# Patient Record
Sex: Female | Born: 1995 | Race: White | Hispanic: No | Marital: Single | Smoking: Current some day smoker
Health system: Southern US, Community
[De-identification: ages and names within clinical notes are randomized; demographics above are authoritative.]

## PROBLEM LIST (undated history)

## (undated) DIAGNOSIS — D649 Anemia, unspecified: Secondary | ICD-10-CM

## (undated) DIAGNOSIS — F32A Depression, unspecified: Secondary | ICD-10-CM

## (undated) DIAGNOSIS — F419 Anxiety disorder, unspecified: Secondary | ICD-10-CM

## (undated) HISTORY — DX: Anemia, unspecified: D64.9

## (undated) HISTORY — DX: Anxiety disorder, unspecified: F41.9

## (undated) HISTORY — DX: Depression, unspecified: F32.A

## (undated) HISTORY — PX: NO PAST SURGERIES: SHX2092

---

## 2021-02-03 ENCOUNTER — Ambulatory Visit
Admission: EM | Admit: 2021-02-03 | Discharge: 2021-02-03 | Disposition: A | Discharge status: Home / Self Care | Payer: Self-pay | Attending: Emergency Medicine | Admitting: Emergency Medicine

## 2021-02-03 ENCOUNTER — Other Ambulatory Visit: Payer: Self-pay

## 2021-02-03 DIAGNOSIS — J36 Peritonsillar abscess: Secondary | ICD-10-CM | POA: Insufficient documentation

## 2021-02-03 LAB — POCT RAPID STREP A (OFFICE): Rapid Strep A Screen: NEGATIVE

## 2021-02-03 MED ORDER — AMOXICILLIN-POT CLAVULANATE 875-125 MG PO TABS: 1.0000 | ORAL_TABLET | Freq: Two times a day (BID) | ORAL | 0 refills | Status: AC

## 2021-02-03 MED ORDER — IBUPROFEN 800 MG PO TABS: 800.0000 mg | ORAL_TABLET | Freq: Three times a day (TID) | ORAL | 0 refills | Status: DC

## 2021-02-03 NOTE — ED Triage Notes (Signed)
Pt present sore throat with enlargement of there tonsils. Pt states symptom started two days ago. Pt states it hurts to swallow and her throat feels dry.

## 2021-02-03 NOTE — ED Provider Notes (Signed)
[facility]    CSN: [id number] Arrival date & time: 02/03/21  1100      History   Chief Complaint Chief Complaint  Patient presents with  . Sore Throat    HPI Debra Ellison is a 25 y.o. female presenting today for evaluation of sore throat.  Reports swollen tonsils beginning 2 days ago.  Reports pain and swelling left-sided.  Swollen lymph node tonight.  Noticing pus and exudate on tonsil.  Reports associated congestion, denies associated cough.  Denies fevers.  HPI  History reviewed. No pertinent past medical history.  There are no problems to display for this patient.   History reviewed. No pertinent surgical history.  OB History   No obstetric history on file.      Home Medications    Prior to Admission medications   Medication Sig Start Date End Date Taking? Authorizing Provider  amoxicillin-clavulanate (AUGMENTIN) 875-125 MG tablet Take 1 tablet by mouth every 12 (twelve) hours for 10 days. 02/03/21 02/13/21 Yes Accalia Rigdon C, PA-C  ibuprofen (ADVIL) 800 MG tablet Take 1 tablet (800 mg total) by mouth 3 (three) times daily. 02/03/21  Yes Yahye Siebert, Junius Creamer, PA-C    Family History Family History  Family history unknown: Yes    Social History Social History   Tobacco Use  . Smoking status: Current Every Day Smoker  . Smokeless tobacco: Current User     Allergies   Patient has no known allergies.   Review of Systems Review of Systems  Constitutional: Negative for activity change, appetite change, chills, fatigue and fever.  HENT: Positive for sore throat. Negative for congestion, ear pain, rhinorrhea, sinus pressure and trouble swallowing.   Eyes: Negative for discharge and redness.  Respiratory: Negative for cough, chest tightness and shortness of breath.   Cardiovascular: Negative for chest pain.  Gastrointestinal: Negative for abdominal pain, diarrhea, nausea and vomiting.  Musculoskeletal: Negative for myalgias.  Skin:  Negative for rash.  Neurological: Negative for dizziness, light-headedness and headaches.     Physical Exam Triage Vital Signs ED Triage Vitals [02/03/21 1138]  Enc Vitals Group     BP      Pulse      Resp      Temp      Temp src      SpO2      Weight      Height      Head Circumference      Peak Flow      Pain Score 10     Pain Loc      Pain Edu?      Excl. in GC?    No data found.  Updated Vital Signs BP 107/75 (BP Location: Left Arm)   Pulse 97   Temp 98.4 F (36.9 C) (Oral)   Resp 16   LMP 02/03/2021   SpO2 98%   Visual Acuity Right Eye Distance:   Left Eye Distance:   Bilateral Distance:    Right Eye Near:   Left Eye Near:    Bilateral Near:     Physical Exam Vitals and nursing note reviewed.  Constitutional:      Appearance: She is well-developed.     Comments: No acute distress  HENT:     Head: Normocephalic and atraumatic.     Ears:     Comments: Bilateral ears without tenderness to palpation of external auricle, tragus and mastoid, EAC's without erythema or swelling, TM's with good bony landmarks and cone of light.  Non erythematous.     Nose: Nose normal.     Mouth/Throat:     Comments: Oral mucosa pink and moist, left tonsillar area with erythema and swelling, no significant exudate noted, uvula midline without swelling or deviation. Posterior pharynx patent and nonerythematous,. Normal phonation.  Eyes:     Conjunctiva/sclera: Conjunctivae normal.  Cardiovascular:     Rate and Rhythm: Normal rate.  Pulmonary:     Effort: Pulmonary effort is normal. No respiratory distress.     Comments: Breathing comfortably at rest, CTABL, no wheezing, rales or other adventitious sounds auscultated Abdominal:     General: There is no distension.  Musculoskeletal:        General: Normal range of motion.     Cervical back: Neck supple.  Skin:    General: Skin is warm and dry.  Neurological:     Mental Status: She is alert and oriented to person,  place, and time.      UC Treatments / Results  Labs (all labs ordered are listed, but only abnormal results are displayed) Labs Reviewed  CULTURE, GROUP A STREP [facility])  POCT RAPID STREP A (OFFICE)    EKG   Radiology No results found.  Procedures Procedures (including critical care time)  Medications Ordered in UC Medications - No data to display  Initial Impression / Assessment and Plan / UC Course  I have reviewed the triage vital signs and the nursing notes.  Pertinent labs & imaging results that were available during my care of the patient were reviewed by me and considered in my medical decision making (see chart for details).     Left-sided tonsillar swelling and erythema, concerning for possible early peritonsillar abscess, treating with Augmentin x10 days and anti-inflammatories for pain and swelling.  Warm compresses.  No airway compromise at this time.  Discussed strict return precautions. Patient verbalized understanding and is agreeable with plan.  Final Clinical Impressions(s) / UC Diagnoses   Final diagnoses:  Peritonsillitis     Discharge Instructions     Begin Augmentin twice daily for 10 days Tylenol and ibuprofen for pain and swelling Warm compresses to neck Follow-up in emergency room if developing increased pain swelling, difficulty breathing    ED Prescriptions    Medication Sig Dispense Auth. Provider   amoxicillin-clavulanate (AUGMENTIN) 875-125 MG tablet Take 1 tablet by mouth every 12 (twelve) hours for 10 days. 20 tablet Karelyn Brisby C, PA-C   ibuprofen (ADVIL) 800 MG tablet Take 1 tablet (800 mg total) by mouth 3 (three) times daily. 21 tablet Mairead Schwarzkopf, [place] C, PA-C     PDMP not reviewed this encounter.   Sharyon Cable [place] C, PA-C 02/03/21 1213

## 2021-02-03 NOTE — Discharge Instructions (Signed)
Begin Augmentin twice daily for 10 days Tylenol and ibuprofen for pain and swelling Warm compresses to neck Follow-up in emergency room if developing increased pain swelling, difficulty breathing

## 2021-02-05 DIAGNOSIS — R569 Unspecified convulsions: Secondary | ICD-10-CM

## 2021-02-05 HISTORY — DX: Unspecified convulsions: R56.9

## 2021-02-06 LAB — CULTURE, GROUP A STREP (THRC)

## 2021-03-08 ENCOUNTER — Emergency Department (HOSPITAL_COMMUNITY)
Admission: EM | Admit: 2021-03-08 | Discharge: 2021-03-09 | Disposition: A | Discharge status: Home / Self Care | Payer: Medicaid - Out of State | Attending: Emergency Medicine | Admitting: Emergency Medicine

## 2021-03-08 ENCOUNTER — Other Ambulatory Visit: Payer: Self-pay

## 2021-03-08 DIAGNOSIS — F17200 Nicotine dependence, unspecified, uncomplicated: Secondary | ICD-10-CM | POA: Insufficient documentation

## 2021-03-08 DIAGNOSIS — N939 Abnormal uterine and vaginal bleeding, unspecified: Secondary | ICD-10-CM | POA: Diagnosis present

## 2021-03-08 DIAGNOSIS — R109 Unspecified abdominal pain: Secondary | ICD-10-CM

## 2021-03-08 LAB — I-STAT BETA HCG BLOOD, ED (MC, WL, AP ONLY): I-stat hCG, quantitative: <5 m[IU]/mL (ref <5)

## 2021-03-08 NOTE — ED Triage Notes (Signed)
Vaginal bleeding x 3days and passed blood clot early today. Just moved here so havent seen a OBgyne yet.

## 2021-03-08 NOTE — ED Provider Notes (Signed)
Emergency Medicine Provider Triage Evaluation Note  Debra Ellison , a 25 y.o. female  was evaluated in triage.  Pt complains of vaginal bleeding in early pregnancy.  She states she is about 4-[redacted] weeks along.  She states that this is her 2nd pregnancy.  She began having bleeding 2 days ago.  She reports associated abdominal cramps.  Also states she had a seizure 3 days ago.    Review of Systems  Positive: Vaginal bleeding, abdominal cramps Negative: fever  Physical Exam  Ht 5\' 4"  (1.626 m)   Wt 75.3 kg   BMI 28.49 kg/m  Gen:   Awake, no distress   Resp:  Normal effort  MSK:   Moves extremities without difficulty    Medical Decision Making  Medically screening exam initiated at 10:46 PM.  Appropriate orders placed.  Debra Ellison was informed that the remainder of the evaluation will be completed by another provider, this initial triage assessment does not replace that evaluation, and the importance of remaining in the ED until their evaluation is complete.  Will verify pregnancy status with HCG.  If positive, will call MAU.  11:17 PM Preg negative.  OK for waiting room for further evaluation of vaginal bleeding.   Barnie Del, PA-C 03/08/21 2318    05/08/21, DO 03/08/21 2339

## 2021-03-09 ENCOUNTER — Emergency Department (HOSPITAL_COMMUNITY): Payer: Medicaid - Out of State

## 2021-03-09 LAB — CBC
HCT: 37.5 % (ref 36.0–46.0)
Hemoglobin: 12 g/dL (ref 12.0–15.0)
MCH: 29.9 pg (ref 26.0–34.0)
MCHC: 32 g/dL (ref 30.0–36.0)
MCV: 93.3 fL (ref 80.0–100.0)
Platelets: 274 10*3/uL (ref 150–400)
RBC: 4.02 MIL/uL (ref 3.87–5.11)
RDW: 14.2 % (ref 11.5–15.5)
WBC: 9.8 10*3/uL (ref 4.0–10.5)
nRBC: 0 % (ref 0.0–0.2)

## 2021-03-09 LAB — PREGNANCY, URINE: Preg Test, Ur: NEGATIVE

## 2021-03-09 LAB — RH IG WORKUP (INCLUDES ABO/RH)
ABO/RH(D): A POS
Gestational Age(Wks): 0

## 2021-03-09 NOTE — ED Provider Notes (Addendum)
[facility] EMERGENCY DEPARTMENT Provider Note   CSN: [id number] Arrival date & time: 03/08/21  2235     History Chief Complaint  Patient presents with  . Vaginal Bleeding    Debra Ellison is a 25 y.o. female.  The history is provided by the patient and medical records.  Vaginal Bleeding   25 y.o. F here with vaginal bleeding.  She thinks she is approx 4-5 weeks, had numerous at home pregnancy tests that were ALL positive.  She states they just moved from [facility] and have not yet found an OBGYN.  She states bleeding started today, passed a large clot, then started having intense cramping.  Denies vomiting, fevers, dysuria, etc.  She reports miscarriage in the past about 2 years ago.    Also reports seizure 3 days ago after she ran out of xanax.  She has since restarted her medication and is back to baseline.  No hx of seizures in the past.    No past medical history on file.  There are no problems to display for this patient.   No past surgical history on file.   OB History   No obstetric history on file.     Family History  Family history unknown: Yes    Social History   Tobacco Use  . Smoking status: Current Every Day Smoker  . Smokeless tobacco: Current User    Home Medications Prior to Admission medications   Medication Sig Start Date End Date Taking? Authorizing Provider  ibuprofen (ADVIL) 800 MG tablet Take 1 tablet (800 mg total) by mouth 3 (three) times daily. 02/03/21   Wieters, Hallie C, PA-C    Allergies    Patient has no known allergies.  Review of Systems   Review of Systems  Genitourinary: Positive for vaginal bleeding.  All other systems reviewed and are negative.   Physical Exam Updated Vital Signs BP 128/80 (BP Location: Right Arm)   Pulse 75   Temp 98.4 F (36.9 C) (Oral)   Resp 18   Ht 5\' 4"  (1.626 m)   Wt 75.3 kg   LMP 01/21/2021   SpO2 100%   BMI 28.49 kg/m   Physical Exam Vitals and nursing note  reviewed.  Constitutional:      Appearance: She is well-developed.  HENT:     Head: Normocephalic and atraumatic.  Eyes:     Conjunctiva/sclera: Conjunctivae normal.     Pupils: Pupils are equal, round, and reactive to light.  Cardiovascular:     Rate and Rhythm: Normal rate and regular rhythm.     Heart sounds: Normal heart sounds.  Pulmonary:     Effort: Pulmonary effort is normal.     Breath sounds: Normal breath sounds.  Abdominal:     General: Bowel sounds are normal.     Palpations: Abdomen is soft.     Tenderness: There is no abdominal tenderness. There is no rebound.  Musculoskeletal:        General: Normal range of motion.     Cervical back: Normal range of motion.  Skin:    General: Skin is warm and dry.  Neurological:     Mental Status: She is alert and oriented to person, place, and time.     ED Results / Procedures / Treatments   Labs (all labs ordered are listed, but only abnormal results are displayed) Labs Reviewed  CBC  PREGNANCY, URINE  I-STAT BETA HCG BLOOD, ED ([facility], [facility], [facility] ONLY)  RH IG WORKUP (  INCLUDES ABO/RH)    EKG None  Radiology US PELVIS (TRANSABDOMINAL ONLY)  Result Date: 03/09/2021 CLINICAL DATA:  Vaginal bleeding. EXAM: TRANSABDOMINAL ULTRASOUND OF PELVIS DOPPLER ULTRASOUND OF OVARIES TECHNIQUE: Both transabdominal and transvaginal ultrasound examinations of the pelvis were performed. Transabdominal technique was performed for global imaging of the pelvis including uterus, ovaries, adnexal regions, and pelvic cul-de-sac. Patient refused transvaginal exam. COMPARISON:  No prior. FINDINGS: Uterus Measurements: 8.0 x 4.1 x 5.1 cm = volume: 87.4 mL. No fibroids or other mass visualized. Endometrium Thickness: 7.5.  No focal abnormality visualized. Right ovary Measurements: 4.0 x 2.3 x 3.7 cm = volume: 17.6 mL. Normal appearance/no adnexal mass. Left ovary Measurements: 3.5 x 2.0 x 2.8 cm = volume: 9.9 mL. Normal appearance/no adnexal mass. Pulsed  Doppler evaluation of both ovaries demonstrates normal low-resistance arterial and venous waveforms. Other findings No abnormal free fluid. IMPRESSION: No acute or focal abnormality identified. No evidence of ovarian torsion. Electronically Signed   By: Maisie Fus  Register   On: 03/09/2021 05:18   US PELVIC DOPPLER (TORSION R/O OR MASS ARTERIAL FLOW)  Result Date: 03/09/2021 CLINICAL DATA:  Vaginal bleeding. EXAM: TRANSABDOMINAL ULTRASOUND OF PELVIS DOPPLER ULTRASOUND OF OVARIES TECHNIQUE: Both transabdominal and transvaginal ultrasound examinations of the pelvis were performed. Transabdominal technique was performed for global imaging of the pelvis including uterus, ovaries, adnexal regions, and pelvic cul-de-sac. Patient refused transvaginal exam. COMPARISON:  No prior. FINDINGS: Uterus Measurements: 8.0 x 4.1 x 5.1 cm = volume: 87.4 mL. No fibroids or other mass visualized. Endometrium Thickness: 7.5.  No focal abnormality visualized. Right ovary Measurements: 4.0 x 2.3 x 3.7 cm = volume: 17.6 mL. Normal appearance/no adnexal mass. Left ovary Measurements: 3.5 x 2.0 x 2.8 cm = volume: 9.9 mL. Normal appearance/no adnexal mass. Pulsed Doppler evaluation of both ovaries demonstrates normal low-resistance arterial and venous waveforms. Other findings No abnormal free fluid. IMPRESSION: No acute or focal abnormality identified. No evidence of ovarian torsion. Electronically Signed   By: Maisie Fus  Register   On: 03/09/2021 05:18    Procedures Procedures   Medications Ordered in ED Medications - No data to display  ED Course  I have reviewed the triage vital signs and the nursing notes.  Pertinent labs & imaging results that were available during my care of the patient were reviewed by me and considered in my medical decision making (see chart for details).    MDM Rules/Calculators/A&P                          25 year old female here with vaginal bleeding.  Reports she is approximately 4 to [redacted] weeks  pregnant with multiple home test that were all positive.  Recently moved here from [place].  States bleeding began today, passed large clot, now with intense cramping.  She is afebrile and nontoxic in appearance.  Her abdomen is soft and nontender.  Vitals are stable.  Labs are not consistent with pregnancy.  When discussing this with her, she remains concerned for miscarriage as she reports "when I was pregnant with my daughter my numbers were undetectable for about 2 months".   Discussed that even if miscarriage today, her hcg should still be positive to a degree.  She is not willing to accept this.  Will obtain US.  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  She is back on her Xanax and has not had any further issues.  Do not feel this requires  further work-up.  5:35 AM [place] without acute findings suggestive of recent miscarriage, no retained products, etc.  Patient given results, still appears to be somewhat in disbelief of this. May ultimately be missed abortion which I have tried to explain to her, not entirely sure she understands.  She is desiring future pregnancy so will refer to OB-GYN for ongoing management.  Can return here for new concerns.  Final Clinical Impression(s) / ED Diagnoses Final diagnoses:  Vaginal bleeding    Rx / DC Orders ED Discharge Orders    None       Garlon Hatchet, PA-C 03/09/21 0538    Garlon Hatchet, PA-C 03/09/21 3662    Marily Memos, MD 03/09/21 [phone]

## 2021-03-09 NOTE — Discharge Instructions (Signed)
Call and schedule appt with OB-GYN. Return here for new concerns.

## 2021-05-29 ENCOUNTER — Other Ambulatory Visit: Payer: Self-pay | Admitting: Obstetrics and Gynecology

## 2021-05-29 DIAGNOSIS — Z363 Encounter for antenatal screening for malformations: Secondary | ICD-10-CM

## 2021-06-15 ENCOUNTER — Ambulatory Visit: Payer: Medicaid Other

## 2021-06-26 ENCOUNTER — Encounter: Payer: Self-pay | Admitting: *Deleted

## 2021-06-28 ENCOUNTER — Other Ambulatory Visit: Payer: Self-pay | Admitting: *Deleted

## 2021-06-28 ENCOUNTER — Ambulatory Visit: Payer: Medicaid Other | Admitting: *Deleted

## 2021-06-28 ENCOUNTER — Ambulatory Visit: Payer: Medicaid Other | Attending: Obstetrics and Gynecology

## 2021-06-28 ENCOUNTER — Ambulatory Visit (HOSPITAL_BASED_OUTPATIENT_CLINIC_OR_DEPARTMENT_OTHER): Payer: Medicaid Other | Admitting: Maternal & Fetal Medicine

## 2021-06-28 ENCOUNTER — Other Ambulatory Visit: Payer: Self-pay

## 2021-06-28 ENCOUNTER — Encounter: Payer: Self-pay | Admitting: *Deleted

## 2021-06-28 VITALS — BP 113/65 | HR 66

## 2021-06-28 DIAGNOSIS — O09891 Supervision of other high risk pregnancies, first trimester: Secondary | ICD-10-CM | POA: Diagnosis not present

## 2021-06-28 DIAGNOSIS — Z8759 Personal history of other complications of pregnancy, childbirth and the puerperium: Secondary | ICD-10-CM

## 2021-06-28 DIAGNOSIS — Z363 Encounter for antenatal screening for malformations: Secondary | ICD-10-CM | POA: Insufficient documentation

## 2021-06-28 DIAGNOSIS — Z362 Encounter for other antenatal screening follow-up: Secondary | ICD-10-CM

## 2021-06-28 IMAGING — US US MFM OB COMPLETE +14 WKS
1 series · 14 of 28 positions shown · non-contrast
Comparison: none

[Series 1: us mfm ob complete +14 wks · 31 acquisitions, 14 frames shown]
[im 2/31]
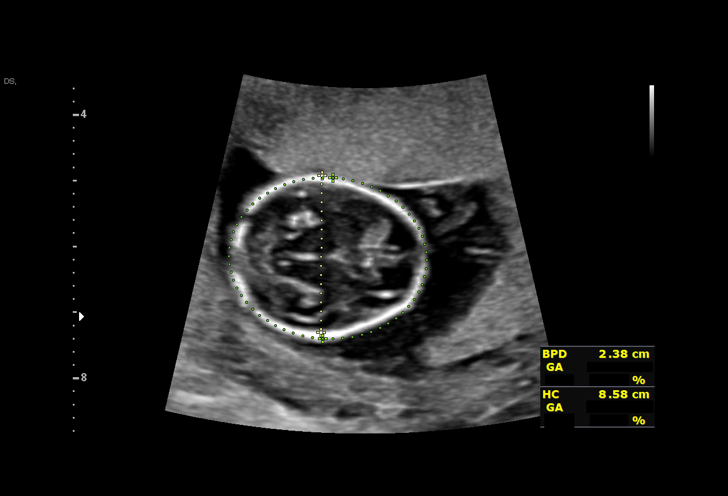
[im 4/31]
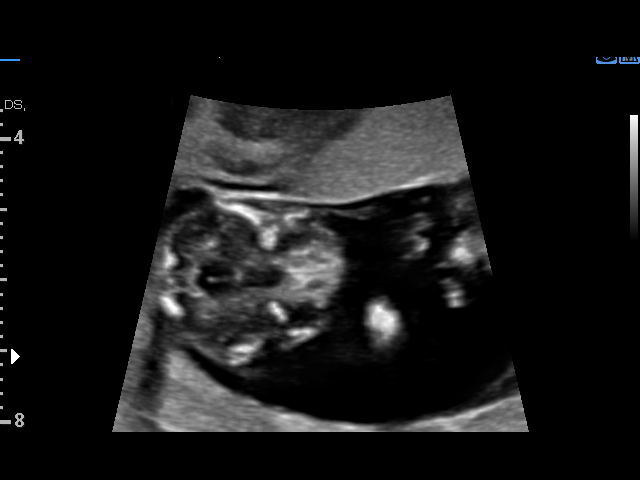
[im 6/31]
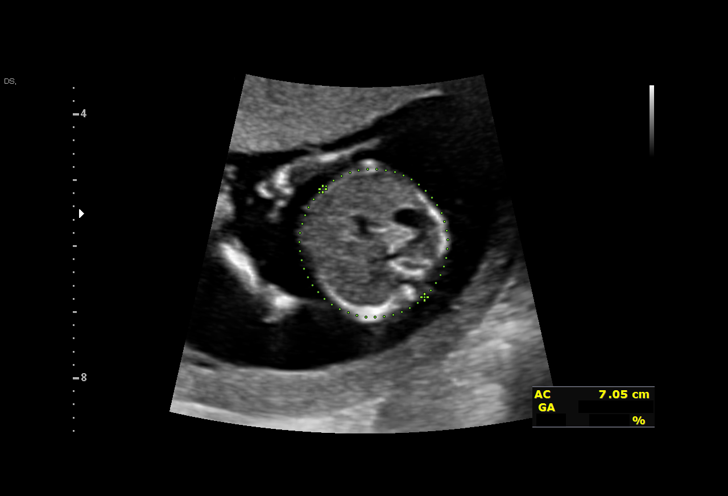
[im 8/31]
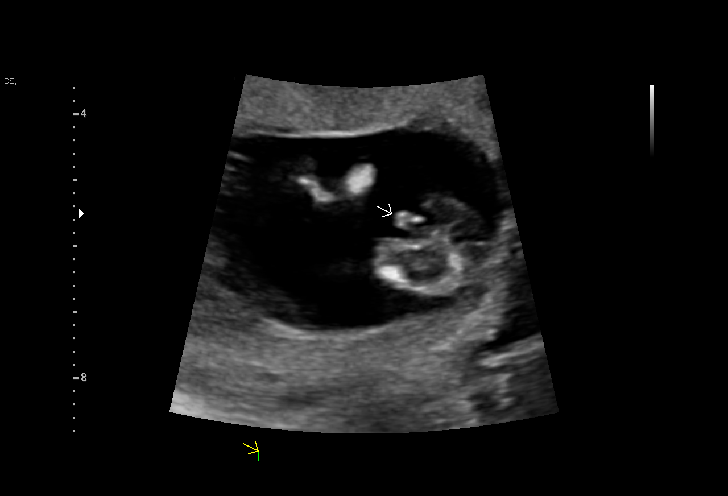
[im 11/31]
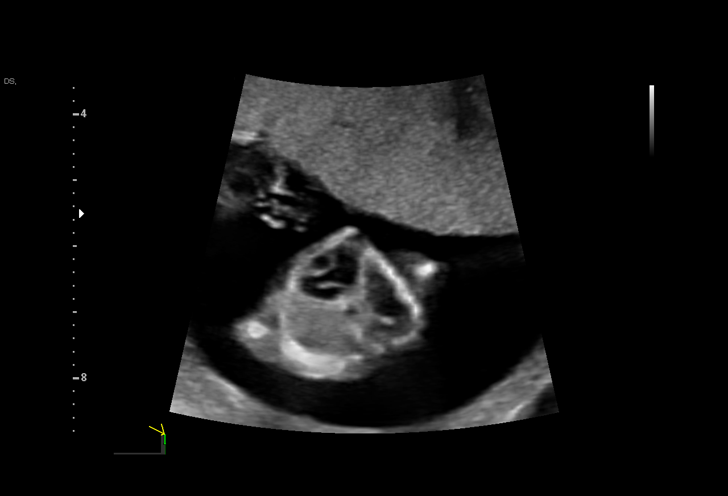
[im 13/31]
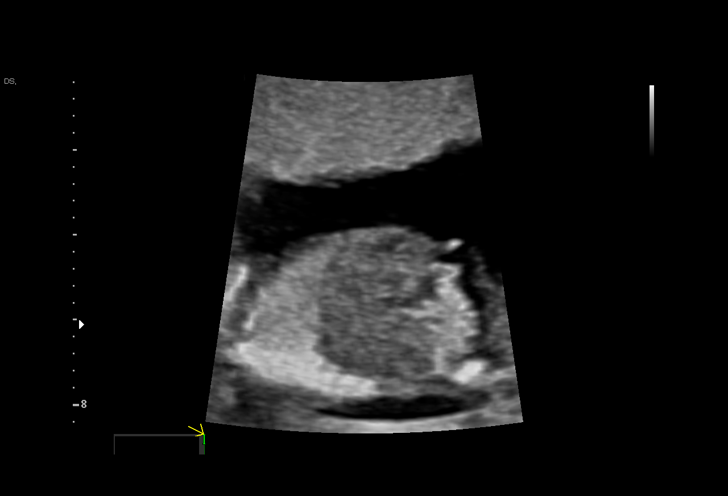
[im 15/31]
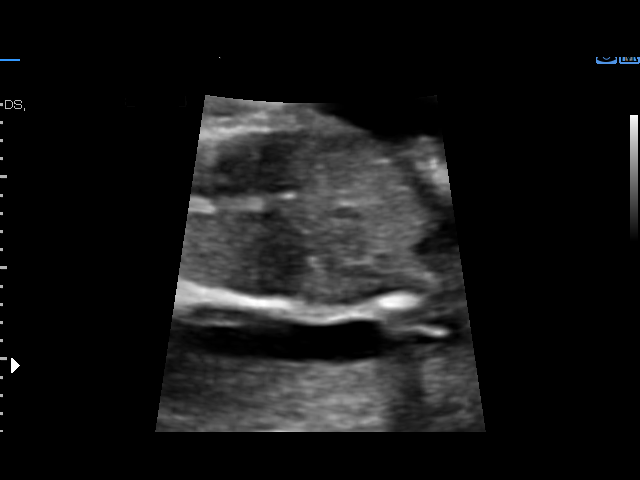
[im 17/31]
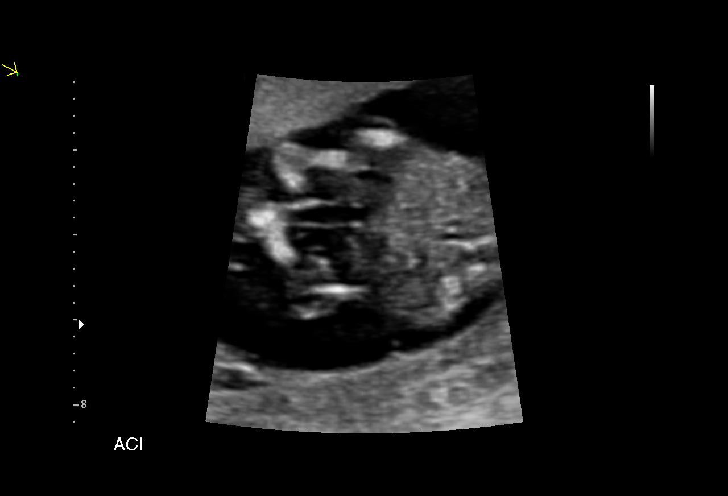
[im 19/31]
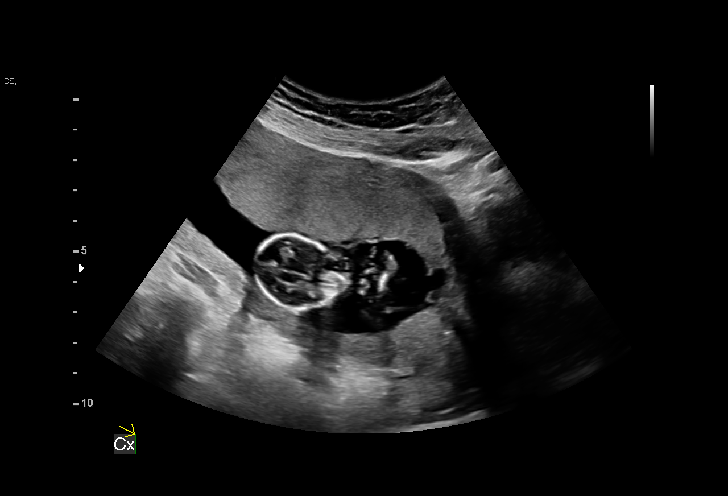
[im 22/31]
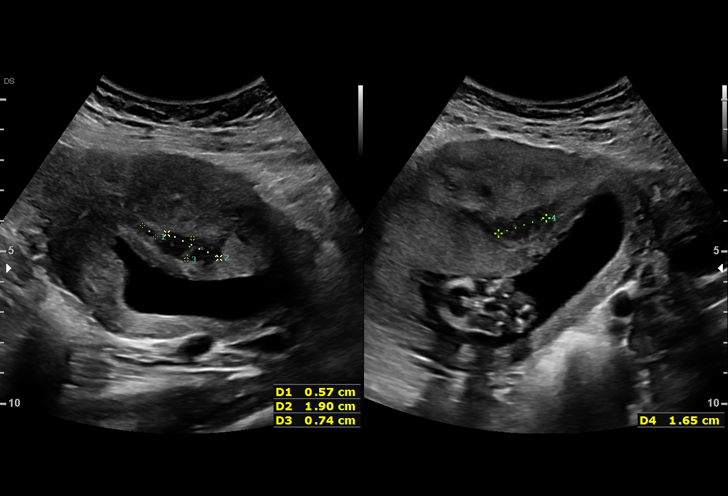
[im 24/31]
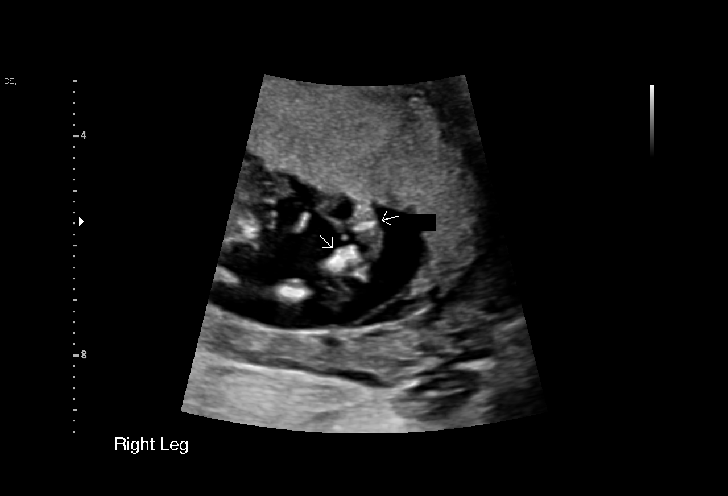
[im 26/31]
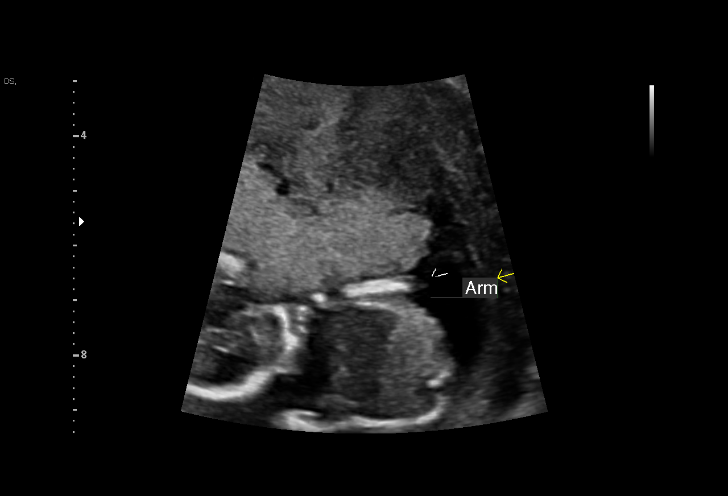
[im 28/31]
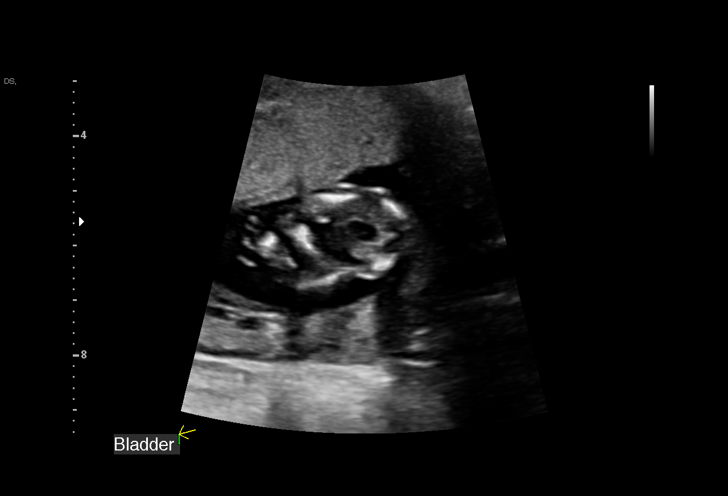
[im 31/31]
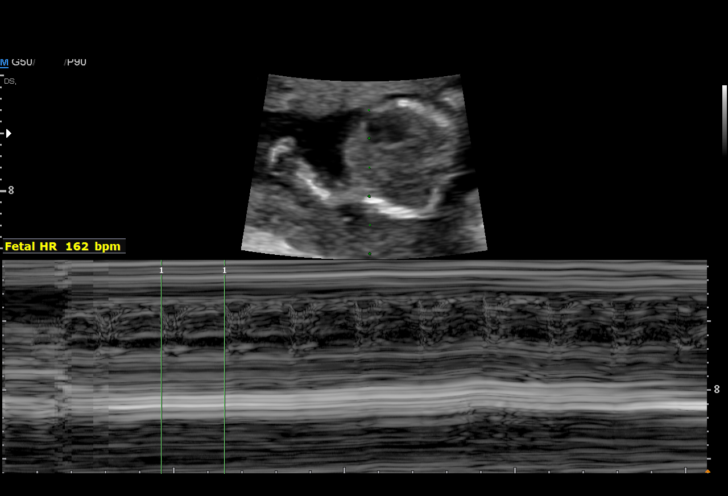

[14 of 28 positions shown; findings below may reference images not displayed]

OBGYN
                                                            [REDACTED]
                   BRONDONNE

 1  US MFM OB COMP LESS THAN              76801.4     BRONDONNE
    14 WEEKS

Indications

 13 weeks gestation of pregnancy
 Encounter for antenatal screening for          [V7]
 malformations
 Poor obstetric history: Previous IUFD          [V7]
 (stillbirth)
Fetal Evaluation

 Num Of Fetuses:         1
 Cardiac Activity:       Observed
 Placenta:               Anterior

 Amniotic Fluid
 AFI FV:      Within normal limits
Biometry

 BPD:      23.8  mm     G. Age:  14w 0d                  CI:        77.15   %    70 - 86
                                                         FL/HC:      14.1   %
 HC:       85.8  mm     G. Age:  13w 5d                  HC/AC:      1.21        1.14 -
 AC:       71.1  mm     G. Age:  13w 5d                  FL/BPD:     50.8   %
 FL:       12.1  mm     G. Age:  13w 4d                  FL/AC:      17.0   %    20 - 24

 Est. FW:      80  gm      0 lb 3 oz
OB History
 Gravidity:    2         Prem:   1
 Living:       0
Gestational Age

 LMP:           13w 4d        Date:  [DATE]                 EDD:   [DATE]
 U/S Today:     13w 5d                                        EDD:   [DATE]
 Best:          13w 4d     Det. By:  LMP  ([DATE])          EDD:   [DATE]
Anatomy

 Cranium:               Appears normal         Stomach:                Appears normal, left
                                                                       sided
 Cavum:                 Appears normal         Abdomen:                Appears normal
 Ventricles:            Appears normal         Abdominal Wall:         Appears nml (cord
                                                                       insert, abd wall)
 Choroid Plexus:        Appears normal         Cord Vessels:           Appears normal (3
                                                                       vessel cord)
 Heart:                 Appears normal         Kidneys:                Appear normal
                        (4CH, axis, and
                        situs)
 LVOT:                  Appears normal         Bladder:                Appears normal
 Diaphragm:             Appears normal

 Other:  Upper extremities appear normal, Left leg appears normal, Right leg
         not seen well.
Cervix Uterus Adnexa

 Cervix
 Normal appearance by transvaginal scan

 Right Ovary
 Within normal limits.

 Left Ovary
 Within normal limits.
Impression

 A single intrauterine pregnancy at 13 w 4 d. Normal first
 trimester anatomy is observed and consistent with dates.
 Good fetal heart rate and amniotic fluid observed.
 A detailed fetal anatomy is precluded due to early gestation.

 A consultation was performed regarding Ms. BRONDONNE history
 of stillbirth with possible fetal hydrocephalus. Please see
 [REDACTED] for details.
Recommendations

 1) Genetic counseling and obtain records from prior stillbirth.
 2) Genetic screening
 3) Wean Xanax or agree on lowest effective dose.
 4) Detailed exam in 6 weeks
 5) Serial growth exams every 4-6 weeks
 6) Shared decision making regarding Wellbutrin for smoking
 cessation vs vaping vs alternative nicotine replacement
 therapy.
 7) Initiate weekly testing between 34-36 weeks (BPP or
 NST/AFI). Recommendations:

## 2021-06-29 NOTE — Progress Notes (Signed)
MFM consultation  Ms. Mccorry is a 25 yo G3P1 who is here at 13w 4d at the request of Dr. 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   She is dated by a 7 week ultrasound giving her EDD of 12/30/21.  Today she is doing well. She is without complaints of vaginal bleeding or loss of fluid. She has not had genetic screening performed yet but plans on it.  Ms. Arps was very tearful and anxious about this visit given her prior history. She could not have her friend accompany her as they had a child with them. We demonstrated a fetal heart beat and communicated that to her partner and she was able to calm down. She reports being alone as she recently moved her from [place] away from family.   Her pregnancy issues include: 1) Stillbirth at [redacted] weeks gestation. Records regarding the pregnancy and delivery were not available at the time of this consultation. She does not recall anything being wrong with her baby in 2013 but that the fetus passed in the womb and it was devastating. Details regarding post delivery testing cytotogenetics, pathology etc are not available. (We will request additional records).  She had a subsequent term baby NSVD at 40 weeks without complication.   2) Anxiety with medication exposure of Xanax and Vaping: She reports that she takes 1 mg daily vs 2mg  TID.  Provider notes document a refill of 1 mg BID. Again Ms. Mortellaro reports 1 pill per day. She also conveyed that she is now taking Wellbutrin which she she reports has allowed her to decreasing her vaping down to every other day 1-2 times per day.   She reports that she is doing better on Wellbutrin and Xanax but she is very nervous about this pregnancy.   Vitals with BMI 06/28/2021 03/09/2021 03/09/2021  Height - - -  Weight - - -  BMI - - -  Systolic 113 118 05/09/2021  Diastolic 65 71 83  Pulse 66 55 72   CBC Latest Ref Rng & Units 03/08/2021  WBC 4.0 - 10.5 K/uL 9.8  Hemoglobin 12.0 -  15.0 g/dL 05/08/2021  Hematocrit 38.1 - 46.0 % 37.5  Platelets 150 - 400 K/uL 274   No flowsheet data found. OB History  Gravida Para Term Preterm AB Living  3 2 1 1  0 1  SAB IAB Ectopic Multiple Live Births  0 0 0 0 1    # Outcome Date GA Lbr Len/2nd Weight Sex Delivery Anes PTL Lv  3 Current           2 Preterm           1 Term            Past Medical History:  Diagnosis Date   Anemia    Anxiety    Depression    Preterm labor    Past Surgical History:  Procedure Laterality Date   NO PAST SURGERIES     Family History  Problem Relation Age of Onset   Depression Mother    Asthma Mother    Depression Father    Cancer Maternal Grandmother        Current Outpatient Medications (Analgesics):    ibuprofen (ADVIL) 800 MG tablet, Take 1 tablet (800 mg total) by mouth 3 (three) times daily. (Patient not taking: Reported on 06/28/2021)   Current Outpatient Medications (Other):    ALPRAZolam (XANAX) 1 MG tablet, Take 1 mg by mouth 2 (two) times daily.  buPROPion HCl (WELLBUTRIN XL PO), Take by mouth.   Prenatal Vit-Fe Fumarate-FA (PRENATAL MULTIVITAMIN) TABS tablet, Take 1 tablet by mouth daily at 12 noon.  No Known Allergies   Imaging:   A single intrauterine pregnancy at 13 w 4 d. Normal first trimester anatomy is observed and consistent with dates. Good fetal heart rate and amniotic fluid observed. A detailed fetal anatomy is precluded due to early gestation.  Impression/Counseling: I discussed with Ms. Bosko that fetal hydrocephalus is linked to X linked and non-Xlinked cases. In suchc cases it has a 10-30% neonatal mortality, developmental delay in up to 90%,  X-linked hydrocephalus ? severe intellectual impairment there is a  50% recurrence risk for female fetuses (females may be carriers) and a 4% recurrence risk for non-X-linked cases.  Without further data it is difficult to understand the nature of the stillbirth and whether or not hydrocephalus was involved and  to th extent. In addition, there is a difference between ventriculomegaly and fetal hydrocephalus.   When hydrocephalus is recurrent it can typically present in the third trimester. Therefore we recommend serial growth exams until 36 weeks. Initiate weekly testing between 34-36 weeks given uncertain cause of stillbirth.   In addition, we recommend genetic counseling. She was not prepared to see our genetic counselor today.  Finally, we will send an authorization form to gain further insight to the prior pregnancy and details regarding the stillbirth.  I encouraged her that the following pregnancy was normal without complication and is a good predictor of future pregnancies including the current pregnancy.  2) Anxiety with exposure to Xanax, Wellbutrin and Vaping.  Ms. Karnes notes that her anxiety can be debilitating at times where she cannot function. Today she conveyed was an example. She can quickly feel suffocated and unable to make good decision. She believes she needs Xanax and doesn't feel that she can go off of the medication. She maintained the medication throughout the pregnancy and discontinued it at 35-36 weeks to prevent the risk of withdrawal.  I explained that Xanax is not associated with birth defects except in lethal doses. Human studies demonstrate mixed results and have reported withdrawal symptoms in neonates and lactating infants. However, we discussed the principle of the lowest effective doses in support of her being a functioning mother.   If she were to wean general goals would be to wean down 0.5 g every week while being aware of withdrawal symptoms if they present increase the duration of the weaning period.   Vaping is known to deliver less nicotine than cigarettes. We discussed the association with fetal growth delays however, vaping and other replacements have had beneficial effects on total cigarette consumption.   Wellbutrin has been studied for its role in  smoking cessation however, double blind RTC's have not shown that quit rates are improved and there was no increase in pregnancy complications as compared to smoking pregnant women.   Recommendations: 1) Genetic counseling and obtain records from prior stillbirth. 2) Genetic screening 3) Wean Xanax or agree on lowest effective dose. 4) Detailed exam in 6 weeks 5) Serial growth exams every 4-6 weeks 6) Shared decision making regarding Wellbutrin for smoking cessation vs vaping vs alternative nicotine replacement therapy. 7) Initiate weekly testing between 34-36 weeks (BPP or NST/AFI).   I spent 60 minutes with > 50% in face to face consultation.  Novella Olive, MD  All questions answered.

## 2021-07-21 ENCOUNTER — Inpatient Hospital Stay (HOSPITAL_BASED_OUTPATIENT_CLINIC_OR_DEPARTMENT_OTHER): Payer: Medicaid Other

## 2021-07-21 ENCOUNTER — Other Ambulatory Visit: Payer: Self-pay

## 2021-07-21 ENCOUNTER — Encounter (HOSPITAL_COMMUNITY): Payer: Self-pay | Admitting: Emergency Medicine

## 2021-07-21 ENCOUNTER — Inpatient Hospital Stay (HOSPITAL_COMMUNITY)
Admission: EM | Admit: 2021-07-21 | Discharge: 2021-07-21 | Disposition: A | Discharge status: Home / Self Care | Payer: Medicaid Other | Attending: Obstetrics and Gynecology | Admitting: Obstetrics and Gynecology

## 2021-07-21 DIAGNOSIS — Z3A16 16 weeks gestation of pregnancy: Secondary | ICD-10-CM

## 2021-07-21 DIAGNOSIS — O4692 Antepartum hemorrhage, unspecified, second trimester: Secondary | ICD-10-CM | POA: Diagnosis not present

## 2021-07-21 DIAGNOSIS — R109 Unspecified abdominal pain: Secondary | ICD-10-CM | POA: Insufficient documentation

## 2021-07-21 DIAGNOSIS — Z87891 Personal history of nicotine dependence: Secondary | ICD-10-CM | POA: Diagnosis not present

## 2021-07-21 DIAGNOSIS — O3680X0 Pregnancy with inconclusive fetal viability, not applicable or unspecified: Secondary | ICD-10-CM

## 2021-07-21 DIAGNOSIS — O26852 Spotting complicating pregnancy, second trimester: Secondary | ICD-10-CM | POA: Insufficient documentation

## 2021-07-21 DIAGNOSIS — O26892 Other specified pregnancy related conditions, second trimester: Secondary | ICD-10-CM | POA: Diagnosis present

## 2021-07-21 DIAGNOSIS — O4690 Antepartum hemorrhage, unspecified, unspecified trimester: Secondary | ICD-10-CM

## 2021-07-21 NOTE — MAU Provider Note (Signed)
Patient Debra Ellison is a 25 y.o. G3P2001  At [redacted]w[redacted]d here with complaints of strong abdominal pain this morning and a quarter-sized blood clot. She denies any other symptoms; she is tearful in bed because RN was unable to detect fetal heart tones. She denies fever, SOB, chest pain.  History     CSN: [id number]  Arrival date and time: 07/21/21 1349   Event Date/Time   First Provider Initiated Contact with Patient 07/21/21 1511      Chief Complaint  Patient presents with   Abdominal Pain   Vaginal Bleeding   Abdominal Pain This is a new problem. The current episode started today. The problem occurs constantly. The pain is located in the LLQ and RLQ. The abdominal pain radiates to the suprapubic region and periumbilical region.  Vaginal Bleeding Associated symptoms include abdominal pain.   OB History     Gravida  3   Para  2   Term  2   Preterm  0   AB      Living  1      SAB      IAB      Ectopic      Multiple      Live Births  1           Past Medical History:  Diagnosis Date   Anemia    Anxiety    Depression    Preterm labor     Past Surgical History:  Procedure Laterality Date   NO PAST SURGERIES      Family History  Problem Relation Age of Onset   Depression Mother    Asthma Mother    Depression Father    Cancer Maternal Grandmother     Social History   Tobacco Use   Smoking status: Former    Types: Cigarettes   Smokeless tobacco: Former  Building services engineer Use: Some days   Substances: Nicotine  Substance Use Topics   Alcohol use: Never   Drug use: Yes    Types: Marijuana    Comment: last used one month ago as of 07/21/2021    Allergies: No Known Allergies  Medications Prior to Admission  Medication Sig Dispense Refill Last Dose   ALPRAZolam (XANAX) 1 MG tablet Take 1 mg by mouth 2 (two) times daily.   07/21/2021 at 1300   buPROPion HCl (WELLBUTRIN XL PO) Take by mouth.      ibuprofen (ADVIL) 800 MG tablet Take 1  tablet (800 mg total) by mouth 3 (three) times daily. (Patient not taking: Reported on 06/28/2021) 21 tablet 0    Prenatal Vit-Fe Fumarate-FA (PRENATAL MULTIVITAMIN) TABS tablet Take 1 tablet by mouth daily at 12 noon.       Review of Systems  Gastrointestinal:  Positive for abdominal pain.  Genitourinary:  Positive for vaginal bleeding.  Physical Exam   Blood pressure (!) 141/92, pulse (!) 102, temperature 98.3 F (36.8 C), temperature source Oral, resp. rate 18, height 5\' 4"  (1.626 m), weight 70.5 kg, last menstrual period 03/25/2021, SpO2 98 %.  Physical Exam Constitutional:      Appearance: She is well-developed.  Pulmonary:     Effort: Pulmonary effort is normal.  Abdominal:     General: Abdomen is flat.     Palpations: Abdomen is soft.  Skin:    General: Skin is warm.  Neurological:     General: No focal deficit present.     Mental Status: She is alert.  Psychiatric:  Mood and Affect: Mood is anxious.    MAU Course  Procedures  MDM Patient was brought immediately into room and briefly assessed before sending straight to [place].   Dr. Judeth Cornfield called MAU to discuss patient's [place] results, which confirm fetal demise. Patient is very upset in bed and waiting for her partner to get here. TC to Dr.Callahan, who will come to the bedside to see patient and discuss options.  Patient did not have vaginal/speculum exam while in MAU.  Assessment and Plan   Dr. Claiborne Billings at the bedside to discuss options with patient.  Debra Ellison 07/21/2021, 3:52 PM

## 2021-07-21 NOTE — MAU Note (Signed)
RN asked patient if she would like to speak to the Chaplain and patient declined.

## 2021-07-21 NOTE — H&P (Signed)
25 y.o. G3P2001 complains of at [redacted]w[redacted]d presents with spotting with wiping and cramping.  In ER no FHT detected so pt sent for [place] which confirmed IUDF measuring approx [redacted]w[redacted]d.  Pt's prenatal course complicated for multiple no-shows to office visit.  Past Medical History:  Diagnosis Date   Anemia    Anxiety    Depression    Preterm labor   IUFD 2013 "35mo" Past Surgical History:  Procedure Laterality Date   NO PAST SURGERIES      Social History   Socioeconomic History   Marital status: Single    Spouse name: Not on file   Number of children: Not on file   Years of education: Not on file   Highest education level: Not on file  Occupational History   Not on file  Tobacco Use   Smoking status: Former    Types: Cigarettes   Smokeless tobacco: Former  Building services engineer Use: Some days   Substances: Nicotine  Substance and Sexual Activity   Alcohol use: Never   Drug use: Yes    Types: Marijuana    Comment: last used one month ago as of 07/21/2021   Sexual activity: Yes  Other Topics Concern   Not on file  Social History Narrative   Not on file   Social Determinants of Health   Financial Resource Strain: Not on file  Food Insecurity: Not on file  Transportation Needs: Not on file  Physical Activity: Not on file  Stress: Not on file  Social Connections: Not on file  Intimate Partner Violence: Not on file    No current facility-administered medications on file prior to encounter.   Current Outpatient Medications on File Prior to Encounter  Medication Sig Dispense Refill   ALPRAZolam (XANAX) 1 MG tablet Take 1 mg by mouth 2 (two) times daily.     buPROPion HCl (WELLBUTRIN XL PO) Take by mouth.     ibuprofen (ADVIL) 800 MG tablet Take 1 tablet (800 mg total) by mouth 3 (three) times daily. (Patient not taking: Reported on 06/28/2021) 21 tablet 0   Prenatal Vit-Fe Fumarate-FA (PRENATAL MULTIVITAMIN) TABS tablet Take 1 tablet by mouth daily at 12 noon.      No Known  Allergies  Vitals:   07/21/21 1406 07/21/21 1449 07/21/21 1456 07/21/21 1512  BP: 132/86  134/83 (!) 141/92  Pulse: 100  (!) 102 (!) 102  Resp: 16  18   Temp: 99.5 F (37.5 C)  98.3 F (36.8 C)   TempSrc: Oral  Oral   SpO2: 100%  98%   Weight:  70.5 kg    Height:  5\' 4"  (1.626 m)      Exam per midwife- see MAU note General: tearful, but conversant Pelvic:  deferred, no active bleeding  A:  [redacted]w[redacted]d IUFD Reviewed management options.  Pt does not want induction, prefers to be put to sleep for D&E.  Have coordinated with Dr. [redacted]w[redacted]d who will call pt on Monday and will have office staff get her schedule for D&E. Cytotec for pre-op sent to pharmacy DIC/Infection precautions reviewed with patient Discharging home in stable condition. Wednesday

## 2021-07-21 NOTE — ED Triage Notes (Signed)
Pt reports abd cramping that started this morning. Also reports some vaginal bleeding. States she is 4 months pregnant.

## 2021-07-21 NOTE — ED Provider Notes (Signed)
Emergency Medicine Provider OB Triage Evaluation Note  Debra Ellison is a 25 y.o. female, G3P1101, at [redacted]w[redacted]d gestation who presents to the emergency department with complaints of abd pain.  Review of  Systems  Positive: abd pain, vaginal bleeding, decrease fetal activitiy Negative: fever, dysuria  Physical Exam  BP 132/86 (BP Location: Right Arm)   Pulse 100   Temp 99.5 F (37.5 C) (Oral)   Resp 16   LMP 03/25/2021   SpO2 100%  General: Awake, no distress  HEENT: Atraumatic  Resp: Normal effort  Cardiac: Normal rate Abd: Nondistended, nontender  MSK: Moves all extremities without difficulty Neuro: Speech clear  Medical Decision Making  Pt evaluated for pregnancy concern and is stable for transfer to MAU. Pt is in agreement with plan for transfer.  2:13 PM Discussed with MAU APP, Natalia Leatherwood, who accepts patient in transfer.  Clinical Impression  No diagnosis found.     Fayrene Helper, PA-C 07/21/21 1414    Blane Ohara, MD 07/26/21 [phone]

## 2021-07-21 NOTE — MAU Note (Signed)
Debra Ellison is a 25 y.o. at [redacted]w[redacted]d here in MAU reporting: since last night has been having intermittent sharp abdominal pain. Also saw some light bleeding. Saw a quarter size spot of dark bleeding and then now it is more pink and only when she wipes.   Onset of complaint: last night  Pain score: 7/10  Vitals:   07/21/21 1406 07/21/21 1456  BP: 132/86 134/83  Pulse: 100 (!) 102  Resp: 16 18  Temp: 99.5 F (37.5 C) 98.3 F (36.8 C)  SpO2: 100% 98%     KCL:EXNTZG to doppler FHT  Lab orders placed from triage: UA

## 2021-07-24 ENCOUNTER — Encounter (HOSPITAL_BASED_OUTPATIENT_CLINIC_OR_DEPARTMENT_OTHER): Payer: Self-pay | Admitting: Obstetrics

## 2021-07-24 ENCOUNTER — Other Ambulatory Visit: Payer: Self-pay

## 2021-07-24 ENCOUNTER — Other Ambulatory Visit (HOSPITAL_COMMUNITY): Payer: Self-pay | Admitting: Obstetrics

## 2021-07-24 ENCOUNTER — Other Ambulatory Visit: Payer: Self-pay | Admitting: Obstetrics

## 2021-07-24 DIAGNOSIS — Z973 Presence of spectacles and contact lenses: Secondary | ICD-10-CM

## 2021-07-24 DIAGNOSIS — O021 Missed abortion: Secondary | ICD-10-CM

## 2021-07-24 HISTORY — DX: Presence of spectacles and contact lenses: Z97.3
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
## 2021-07-25 ENCOUNTER — Encounter (HOSPITAL_BASED_OUTPATIENT_CLINIC_OR_DEPARTMENT_OTHER)
Admission: RE | Disposition: A | Discharge status: Home / Self Care | Payer: Self-pay | Source: Home / Self Care | Attending: Obstetrics

## 2021-07-25 ENCOUNTER — Ambulatory Visit (HOSPITAL_BASED_OUTPATIENT_CLINIC_OR_DEPARTMENT_OTHER): Payer: Medicaid Other | Admitting: Anesthesiology

## 2021-07-25 ENCOUNTER — Ambulatory Visit (HOSPITAL_COMMUNITY): Admission: RE | Admit: 2021-07-25 | Payer: Medicaid Other | Source: Ambulatory Visit | Admitting: Obstetrics

## 2021-07-25 ENCOUNTER — Ambulatory Visit (HOSPITAL_COMMUNITY)
Admission: RE | Admit: 2021-07-25 | Discharge: 2021-07-25 | Disposition: A | Discharge status: Home / Self Care | Payer: Medicaid Other | Source: Home / Self Care | Attending: Obstetrics | Admitting: Obstetrics

## 2021-07-25 ENCOUNTER — Ambulatory Visit (HOSPITAL_COMMUNITY)
Admission: RE | Admit: 2021-07-25 | Discharge: 2021-07-25 | Disposition: A | Discharge status: Home / Self Care | Payer: Medicaid Other | Attending: Obstetrics | Admitting: Obstetrics

## 2021-07-25 ENCOUNTER — Encounter (HOSPITAL_BASED_OUTPATIENT_CLINIC_OR_DEPARTMENT_OTHER): Payer: Self-pay | Admitting: Obstetrics

## 2021-07-25 ENCOUNTER — Other Ambulatory Visit: Payer: Self-pay

## 2021-07-25 DIAGNOSIS — Z79899 Other long term (current) drug therapy: Secondary | ICD-10-CM | POA: Diagnosis not present

## 2021-07-25 DIAGNOSIS — F17210 Nicotine dependence, cigarettes, uncomplicated: Secondary | ICD-10-CM | POA: Insufficient documentation

## 2021-07-25 DIAGNOSIS — O021 Missed abortion: Secondary | ICD-10-CM

## 2021-07-25 DIAGNOSIS — O99332 Smoking (tobacco) complicating pregnancy, second trimester: Secondary | ICD-10-CM | POA: Diagnosis not present

## 2021-07-25 HISTORY — PX: OPERATIVE ULTRASOUND: SHX5996

## 2021-07-25 HISTORY — PX: DILATION AND EVACUATION: SHX1459

## 2021-07-25 SURGERY — DILATION AND EVACUATION, UTERUS, SECOND TRIMESTER
Anesthesia: General | Site: Uterus

## 2021-07-25 MED ORDER — OXYCODONE HCL 5 MG PO TABS
5.0000 mg | ORAL_TABLET | Freq: Once | ORAL | Status: AC
  Administered 2021-07-25: 5 mg via ORAL

## 2021-07-25 MED ORDER — DEXMEDETOMIDINE (PRECEDEX) IN NS 20 MCG/5ML (4 MCG/ML) IV SYRINGE
PREFILLED_SYRINGE | INTRAVENOUS | Status: DC | PRN
  Administered 2021-07-25: 8 ug via INTRAVENOUS

## 2021-07-25 MED ORDER — LACTATED RINGERS IV SOLN: INTRAVENOUS | Status: DC

## 2021-07-25 MED ORDER — WHITE PETROLATUM EX OINT
TOPICAL_OINTMENT | CUTANEOUS | Status: AC
  Filled 2021-07-25: qty 5

## 2021-07-25 MED ORDER — MIDAZOLAM HCL 2 MG/2ML IJ SOLN
INTRAMUSCULAR | Status: AC
  Filled 2021-07-25: qty 2

## 2021-07-25 MED ORDER — DEXAMETHASONE SODIUM PHOSPHATE 10 MG/ML IJ SOLN
INTRAMUSCULAR | Status: DC | PRN
  Administered 2021-07-25: 10 mg via INTRAVENOUS

## 2021-07-25 MED ORDER — ONDANSETRON HCL 4 MG/2ML IJ SOLN
INTRAMUSCULAR | Status: DC | PRN
  Administered 2021-07-25: 4 mg via INTRAVENOUS

## 2021-07-25 MED ORDER — FENTANYL CITRATE (PF) 100 MCG/2ML IJ SOLN
INTRAMUSCULAR | Status: AC
  Filled 2021-07-25: qty 2

## 2021-07-25 MED ORDER — PROPOFOL 10 MG/ML IV BOLUS
INTRAVENOUS | Status: DC | PRN
  Administered 2021-07-25: 450 mg via INTRAVENOUS

## 2021-07-25 MED ORDER — DEXMEDETOMIDINE (PRECEDEX) IN NS 20 MCG/5ML (4 MCG/ML) IV SYRINGE
PREFILLED_SYRINGE | INTRAVENOUS | Status: AC
  Filled 2021-07-25: qty 5

## 2021-07-25 MED ORDER — DOXYCYCLINE HYCLATE 100 MG IV SOLR
200.0000 mg | Freq: Once | INTRAVENOUS | Status: AC
  Administered 2021-07-25: 200 mg via INTRAVENOUS
  Filled 2021-07-25: qty 200

## 2021-07-25 MED ORDER — GLYCOPYRROLATE PF 0.2 MG/ML IJ SOSY
PREFILLED_SYRINGE | INTRAMUSCULAR | Status: DC | PRN
  Administered 2021-07-25: .2 mg via INTRAVENOUS

## 2021-07-25 MED ORDER — PROPOFOL 10 MG/ML IV BOLUS
INTRAVENOUS | Status: AC
  Filled 2021-07-25: qty 20

## 2021-07-25 MED ORDER — FENTANYL CITRATE (PF) 250 MCG/5ML IJ SOLN
INTRAMUSCULAR | Status: AC
  Filled 2021-07-25: qty 5

## 2021-07-25 MED ORDER — EPHEDRINE 5 MG/ML INJ
INTRAVENOUS | Status: AC
  Filled 2021-07-25: qty 5

## 2021-07-25 MED ORDER — MIDAZOLAM HCL 5 MG/5ML IJ SOLN
INTRAMUSCULAR | Status: DC | PRN
  Administered 2021-07-25 (×2): 2 mg via INTRAVENOUS

## 2021-07-25 MED ORDER — ACETAMINOPHEN 10 MG/ML IV SOLN: 1000.0000 mg | Freq: Once | INTRAVENOUS | Status: DC | PRN

## 2021-07-25 MED ORDER — ACETAMINOPHEN 500 MG PO TABS
ORAL_TABLET | ORAL | Status: AC
  Filled 2021-07-25: qty 2

## 2021-07-25 MED ORDER — FENTANYL CITRATE (PF) 100 MCG/2ML IJ SOLN
25.0000 ug | INTRAMUSCULAR | Status: DC | PRN
  Administered 2021-07-25 (×3): 25 ug via INTRAVENOUS

## 2021-07-25 MED ORDER — ACETAMINOPHEN 500 MG PO TABS
1000.0000 mg | ORAL_TABLET | ORAL | Status: AC
  Administered 2021-07-25: 1000 mg via ORAL

## 2021-07-25 MED ORDER — EPHEDRINE SULFATE-NACL 50-0.9 MG/10ML-% IV SOSY
PREFILLED_SYRINGE | INTRAVENOUS | Status: DC | PRN
  Administered 2021-07-25: 5 mg via INTRAVENOUS

## 2021-07-25 MED ORDER — DEXAMETHASONE SODIUM PHOSPHATE 10 MG/ML IJ SOLN
INTRAMUSCULAR | Status: AC
  Filled 2021-07-25: qty 1

## 2021-07-25 MED ORDER — FENTANYL CITRATE (PF) 100 MCG/2ML IJ SOLN
INTRAMUSCULAR | Status: DC | PRN
  Administered 2021-07-25 (×2): 100 ug via INTRAVENOUS

## 2021-07-25 MED ORDER — LIDOCAINE 2% (20 MG/ML) 5 ML SYRINGE
INTRAMUSCULAR | Status: DC | PRN
  Administered 2021-07-25: 60 mg via INTRAVENOUS

## 2021-07-25 MED ORDER — LIDOCAINE HCL 1 % IJ SOLN
INTRAMUSCULAR | Status: DC | PRN
  Administered 2021-07-25: 10 mL

## 2021-07-25 MED ORDER — LIDOCAINE 2% (20 MG/ML) 5 ML SYRINGE
INTRAMUSCULAR | Status: AC
  Filled 2021-07-25: qty 5

## 2021-07-25 MED ORDER — ONDANSETRON HCL 4 MG/2ML IJ SOLN
INTRAMUSCULAR | Status: AC
  Filled 2021-07-25: qty 2

## 2021-07-25 MED ORDER — OXYCODONE HCL 5 MG PO TABS
ORAL_TABLET | ORAL | Status: AC
  Filled 2021-07-25: qty 1

## 2021-07-25 MED ORDER — METHYLERGONOVINE MALEATE 0.2 MG/ML IJ SOLN
INTRAMUSCULAR | Status: DC | PRN
  Administered 2021-07-25: .2 mg via INTRAMUSCULAR

## 2021-07-25 MED ORDER — GLYCOPYRROLATE PF 0.2 MG/ML IJ SOSY
PREFILLED_SYRINGE | INTRAMUSCULAR | Status: AC
  Filled 2021-07-25: qty 1

## 2021-07-25 SURGICAL SUPPLY — 27 items
CATH ROBINSON RED A/P 16FR (CATHETERS) ×2 IMPLANT
CNTNR URN SCR LID CUP LEK RST (MISCELLANEOUS) IMPLANT
CONT SPEC 4OZ STRL OR WHT (MISCELLANEOUS)
DECANTER SPIKE VIAL GLASS SM (MISCELLANEOUS) ×2 IMPLANT
DILATOR CANAL MILEX (MISCELLANEOUS) IMPLANT
FILTER UTR ASPR ASSEMBLY (MISCELLANEOUS) ×2 IMPLANT
GAUZE 4X4 16PLY ~~LOC~~+RFID DBL (SPONGE) ×2 IMPLANT
GLOVE SURG LTX SZ6 (GLOVE) ×2 IMPLANT
GLOVE SURG UNDER POLY LF SZ6 (GLOVE) ×2 IMPLANT
GOWN STRL REUS W/TWL LRG LVL3 (GOWN DISPOSABLE) ×4 IMPLANT
HOSE CONNECTING 18IN BERKELEY (TUBING) ×2 IMPLANT
KIT BERKELEY 1ST TRI 3/8 NO TR (MISCELLANEOUS) ×2 IMPLANT
KIT BERKELEY 1ST TRIMESTER 3/8 (MISCELLANEOUS) IMPLANT
KIT BERKELEY 2ND TRIMESTER 1/2 (COLLECTOR) ×2 IMPLANT
KIT TURNOVER CYSTO (KITS) ×2 IMPLANT
NS IRRIG 1000ML POUR BTL (IV SOLUTION) ×2 IMPLANT
PACK VAGINAL MINOR WOMEN LF (CUSTOM PROCEDURE TRAY) ×2 IMPLANT
PAD OB MATERNITY 4.3X12.25 (PERSONAL CARE ITEMS) ×2 IMPLANT
SET BERKELEY SUCTION TUBING (SUCTIONS) IMPLANT
TUBE VACURETTE 2ND TRIMESTER (CANNULA) ×2 IMPLANT
UNDERPAD 30X36 HEAVY ABSORB (UNDERPADS AND DIAPERS) ×2 IMPLANT
VACURETTE 10 RIGID CVD (CANNULA) IMPLANT
VACURETTE 14MM CVD 1/2 BASE (CANNULA) ×2 IMPLANT
VACURETTE 16MM ASPIR CVD .5 (CANNULA) IMPLANT
VACURETTE 7MM CVD STRL WRAP (CANNULA) IMPLANT
VACURETTE 8 RIGID CVD (CANNULA) IMPLANT
VACURETTE 9 RIGID CVD (CANNULA) IMPLANT

## 2021-07-25 NOTE — H&P (Signed)
25 y.o. G3P2001 @ [redacted]w[redacted]d presents for D&E for missed abortion.  She presented to the maternity admission unit on 10/14 with LLQ.  Ultrasound at that time with fetus measuring about [redacted]w[redacted]d with absent fetal cardiac activity, consistent with fetal demise.  Pregnancy complicated by benzodiazepine use (xanax) BID for anxiety as well as history of fetal demise with G1.  She had a low risk NIPT in the first trimester.  She was unable to obtain the misoprostol prescribed to take this morning prior to surgery and did not notify the office.   Past Medical History:  Diagnosis Date   Anemia    Anxiety    Depression    Preterm labor    Seizure (HCC) 02/2021   Unsure if this was a true seizure. Patient states that she did not take her xanax and this caused her to have a seizure. Patient states that she passed out and was told that she was clenching her teeth. She has had no further problems and takes her Xanax daily.   Wears glasses 07/24/2021    Past Surgical History:  Procedure Laterality Date   NO PAST SURGERIES      OB History  Gravida Para Term Preterm AB Living  3 2 2  0   1  SAB IAB Ectopic Multiple Live Births          1    # Outcome Date GA Lbr Len/2nd Weight Sex Delivery Anes PTL Lv  3 Current           2 Term 11/06/15     Vag-Spont     1 Term 2013 [redacted]w[redacted]d       FD    Social History   Socioeconomic History   Marital status: Single    Spouse name: Not on file   Number of children: Not on file   Years of education: Not on file   Highest education level: Not on file  Occupational History   Not on file  Tobacco Use   Smoking status: Some Days    Types: Cigarettes   Smokeless tobacco: Former  [redacted]w[redacted]d Use: Some days   Substances: Nicotine  Substance and Sexual Activity   Alcohol use: Never   Drug use: Yes    Types: Marijuana    Comment: last used one month ago as of 07/21/2021   Sexual activity: Yes  Other Topics Concern   Not on file  Social History Narrative   Not  on file   Social Determinants of Health   Financial Resource Strain: Not on file  Food Insecurity: Not on file  Transportation Needs: Not on file  Physical Activity: Not on file  Stress: Not on file  Social Connections: Not on file  Intimate Partner Violence: Not on file   Patient has no known allergies.    ABO, Rh: --/--/A POS (06/02 0405)   Vitals:   07/25/21 0603  BP: 120/87  Pulse: 94  Resp: 17  Temp: 97.8 F (36.6 C)  SpO2: 100%     General:  NAD Abdomen:  soft Ex:  no edema FHTs:  Absent    A/P   25 y.o.  G3P2001  [redacted]w[redacted]d by LMP presents for dilation and evacuation for missed abortion.  Fetus measuring approximately [redacted]w[redacted]d by ultrasound.  Elects for surgical management.  Discussed risks to include infection, bleeding, damage to surrounding structures (including but not limited to vagina, cervix, bladder, uterus), uterine perforation, need for additional procedures.  All questions answered  and patient elects to proceed.   Recommend anora tissue analysis given second trimester demise and history of prior third trimester IUFD.  Patient agrees.  Rh: positive  Alyssa Rotondo GEFFEL Perris Conwell

## 2021-07-25 NOTE — Anesthesia Procedure Notes (Signed)
Procedure Name: LMA Insertion Date/Time: 07/25/2021 7:48 AM Performed by: Bishop Limbo, CRNA Pre-anesthesia Checklist: Patient identified, Emergency Drugs available, Suction available and Patient being monitored Patient Re-evaluated:Patient Re-evaluated prior to induction Oxygen Delivery Method: Circle System Utilized Preoxygenation: Pre-oxygenation with 100% oxygen Induction Type: IV induction Ventilation: Mask ventilation without difficulty LMA: LMA inserted LMA Size: 4.0 Number of attempts: 1 Airway Equipment and Method: Bite block Placement Confirmation: positive ETCO2 Tube secured with: Tape Dental Injury: Teeth and Oropharynx as per pre-operative assessment

## 2021-07-25 NOTE — Anesthesia Preprocedure Evaluation (Signed)
Anesthesia Evaluation  Patient identified by MRN, date of birth, ID band Patient awake    Reviewed: Allergy & Precautions, NPO status , Patient's Chart, lab work & pertinent test results  Airway Mallampati: II  TM Distance: >3 FB Neck ROM: Full    Dental  (+) Teeth Intact   Pulmonary neg pulmonary ROS, Current Smoker,    Pulmonary exam normal        Cardiovascular negative cardio ROS   Rhythm:Regular Rate:Normal     Neuro/Psych Seizures - (single episode 05/22), Well Controlled,  Anxiety Depression    GI/Hepatic negative GI ROS, Neg liver ROS,   Endo/Other  negative endocrine ROS  Renal/GU negative Renal ROS  negative genitourinary   Musculoskeletal negative musculoskeletal ROS (+)   Abdominal (+)  Abdomen: soft.    Peds  Hematology  (+) anemia ,   Anesthesia Other Findings   Reproductive/Obstetrics Missed abortion, second trimester                             Anesthesia Physical Anesthesia Plan  ASA: 2  Anesthesia Plan: General   Post-op Pain Management:    Induction: Intravenous  PONV Risk Score and Plan: 2 and Ondansetron, Dexamethasone, Midazolam and Treatment may vary due to age or medical condition  Airway Management Planned: Mask and LMA  Additional Equipment: None  Intra-op Plan:   Post-operative Plan: Extubation in OR  Informed Consent: I have reviewed the patients History and Physical, chart, labs and discussed the procedure including the risks, benefits and alternatives for the proposed anesthesia with the patient or authorized representative who has indicated his/her understanding and acceptance.     Dental advisory given  Plan Discussed with: CRNA  Anesthesia Plan Comments:         Anesthesia Quick Evaluation

## 2021-07-25 NOTE — Op Note (Signed)
Pre-Operative Diagnosis: Missed abortion at 16 weeks   Postoperative Diagnosis: Missed abortion at 16 weeks   Procedure: Dilation and evacuation under ultrasound guidance   Surgeon: Marlow Baars, MD   Operative Findings: Enlarged 15 week uterus with missed abortion.  Following completion of procedure, all extremities, thorax and cranium identified   Specimen: Products of conception to pathology.  Tissue sample to Anora for chromosome analysis   EBL: 200 mL    Procedure:  After adequate anesthesia was achieved, the patient placed in the dorsal lithotomy position in [place] stirrups.  She was prepped and draped in the usual sterile fashion.  The bivalve speculum was placed in the vagina and the anterior lip of the cervix grasped with a single-tooth tenaculum.   10cc of 1% lidocaine was administered in a paracervical fashion. Ultrasound guidance was utilized for the entire procedure. The cervix was serially dilated with Shawnie Pons dilators.  A 14 suction curette was advanced to the fundus, the vacuum was engaged, and multiple suction passes were performed.  Sopher forceps were then used to evacuate the remainder of the fetal tissue. A final suction pass was performed with minimal results and good uterine crie noted in all quadrants. Ultrasound showed homogeneous strip. This completed the procedure. A bimanual massage was performed and confirmed good uterine tone.  Given gestational age of greater than 14 weeks, a single prophylactic dose of 0.2 mg IM methergine was given. All instruments were removed from the vagina.  Pressure was used to achieve hemostasis at the tenaculum site. The patient tolerated the procedure well was brought to the recovery room in stable condition for the procedure. All sponge and needle counts correct x2.

## 2021-07-25 NOTE — Discharge Instructions (Addendum)
Please take motrin (ibuprofen) 800mg  every 6 hours as needed for pain.  You may add 1000mg  of acetaminophen (tylenol) every 6 hours as well.  Nothing in the vagina for 2 weeks (no sex, no tampons)  Next dose of tylenol after 12:15 PM today if needed.   DISCHARGE INSTRUCTIONS: D&C / D&E The following instructions have been prepared to help you care for yourself upon your return home.   Personal hygiene:  Use sanitary pads for vaginal drainage, not tampons.  Shower the day after your procedure.  NO tub baths, pools or Jacuzzis for 2-3 weeks.  Wipe front to back after using the bathroom.  Activity and limitations:  Do NOT drive or operate any equipment for 24 hours. The effects of anesthesia are still present and drowsiness may result.  Do NOT rest in bed all day.  Walking is encouraged.  Walk up and down stairs slowly.  You may resume your normal activity in one to two days or as indicated by your physician.  Sexual activity: NO intercourse for at least 2 weeks after the procedure, or as indicated by your physician.  Diet: Eat a light meal as desired this evening. You may resume your usual diet tomorrow.  Return to work: You may resume your work activities in one to two days or as indicated by your doctor.  What to expect after your surgery: Expect to have vaginal bleeding/discharge for 2-3 days and spotting for up to 10 days. It is not unusual to have soreness for up to 1-2 weeks. You may have a slight burning sensation when you urinate for the first day. Mild cramps may continue for a couple of days. You may have a regular period in 2-6 weeks.  Call your doctor for any of the following:  Excessive vaginal bleeding, saturating and changing one pad every hour.  Inability to urinate 6 hours after discharge from hospital.  Pain not relieved by pain medication.  Fever of 100.4 F or greater.  Unusual vaginal discharge or odor.   Call for an appointment:      Post Anesthesia Home  Care Instructions  Activity: Get plenty of rest for the remainder of the day. A responsible individual must stay with you for 24 hours following the procedure.  For the next 24 hours, DO NOT: -Drive a car - -Drink alcoholic beverages -Take any medication unless instructed by your physician -Make any legal decisions or sign important papers.  Meals: Start with liquid foods such as gelatin or soup. Progress to regular foods as tolerated. Avoid greasy, spicy, heavy foods. If nausea and/or vomiting occur, drink only clear liquids until the nausea and/or vomiting subsides. Call your physician if vomiting continues.  Special Instructions/Symptoms: Your throat may feel dry or sore from the anesthesia or the breathing tube placed in your throat during surgery. If this causes discomfort, gargle with warm salt water. The discomfort should disappear within 24 hours.

## 2021-07-25 NOTE — Transfer of Care (Signed)
Immediate Anesthesia Transfer of Care Note  Patient: Roselynn Whitacre  Procedure(s) Performed: DILATATION AND EVACUATION (D&E) 2ND TRIMESTER (Uterus) OPERATIVE ULTRASOUND (Uterus)  Patient Location: PACU  Anesthesia Type:General  Level of Consciousness: awake, alert , oriented and patient cooperative  Airway & Oxygen Therapy: Patient Spontanous Breathing and Patient connected to face mask oxygen  Post-op Assessment: Report given to RN and Post -op Vital signs reviewed and stable  Post vital signs: Reviewed and stable  Last Vitals:  Vitals Value Taken Time  BP 138/98 07/25/21 0818  Temp    Pulse 113 07/25/21 0821  Resp 30 07/25/21 0821  SpO2 100 % 07/25/21 0821  Vitals shown include unvalidated device data.  Last Pain:  Vitals:   07/25/21 0603  TempSrc: Oral  PainSc: 2       Patients Stated Pain Goal: 4 (07/25/21 0603)  Complications: No notable events documented.

## 2021-07-25 NOTE — Anesthesia Postprocedure Evaluation (Signed)
Anesthesia Post Note  Patient: Alan Riles  Procedure(s) Performed: DILATATION AND EVACUATION (D&E) 2ND TRIMESTER (Uterus) OPERATIVE ULTRASOUND (Uterus)     Patient location during evaluation: PACU Anesthesia Type: General Level of consciousness: awake and alert Pain management: pain level controlled Vital Signs Assessment: post-procedure vital signs reviewed and stable Respiratory status: spontaneous breathing, nonlabored ventilation, respiratory function stable and patient connected to nasal cannula oxygen Cardiovascular status: blood pressure returned to baseline and stable Postop Assessment: no apparent nausea or vomiting Anesthetic complications: no   No notable events documented.  Last Vitals:  Vitals:   07/25/21 0845 07/25/21 0930  BP: 120/84 115/89  Pulse: (!) 104 88  Resp: 20 18  Temp:  36.5 C  SpO2: 100% 100%    Last Pain:  Vitals:   07/25/21 0930  TempSrc:   PainSc: 4                  Breon Diss P Alan Drummer

## 2021-07-26 ENCOUNTER — Encounter (HOSPITAL_BASED_OUTPATIENT_CLINIC_OR_DEPARTMENT_OTHER): Payer: Self-pay | Admitting: Obstetrics

## 2021-07-26 LAB — SURGICAL PATHOLOGY

## 2021-08-07 ENCOUNTER — Encounter: Payer: Self-pay | Admitting: *Deleted

## 2021-08-09 ENCOUNTER — Ambulatory Visit: Payer: Medicaid Other

## 2022-02-12 ENCOUNTER — Ambulatory Visit
Admission: EM | Admit: 2022-02-12 | Discharge: 2022-02-12 | Disposition: A | Discharge status: Home / Self Care | Payer: Medicaid Other | Attending: Physician Assistant | Admitting: Physician Assistant

## 2022-02-12 DIAGNOSIS — K0889 Other specified disorders of teeth and supporting structures: Secondary | ICD-10-CM | POA: Diagnosis not present

## 2022-02-12 MED ORDER — AMOXICILLIN 500 MG PO CAPS: 500.0000 mg | ORAL_CAPSULE | Freq: Three times a day (TID) | ORAL | 0 refills | Status: DC

## 2022-02-12 NOTE — ED Triage Notes (Signed)
Patient presents to Urgent Care with complaints of cracked back right molar since saturday. Patient reports new to town no dentist yet, pain in face and to ear.  ? ?

## 2022-02-19 ENCOUNTER — Emergency Department (HOSPITAL_COMMUNITY)
Admission: EM | Admit: 2022-02-19 | Discharge: 2022-02-19 | Disposition: A | Discharge status: Home / Self Care | Payer: Medicaid Other | Attending: Emergency Medicine | Admitting: Emergency Medicine

## 2022-02-19 ENCOUNTER — Encounter (HOSPITAL_COMMUNITY): Payer: Self-pay

## 2022-02-19 ENCOUNTER — Other Ambulatory Visit: Payer: Self-pay

## 2022-02-19 DIAGNOSIS — R202 Paresthesia of skin: Secondary | ICD-10-CM | POA: Insufficient documentation

## 2022-02-19 DIAGNOSIS — F17290 Nicotine dependence, other tobacco product, uncomplicated: Secondary | ICD-10-CM | POA: Diagnosis not present

## 2022-02-19 DIAGNOSIS — F419 Anxiety disorder, unspecified: Secondary | ICD-10-CM | POA: Diagnosis not present

## 2022-02-19 DIAGNOSIS — R42 Dizziness and giddiness: Secondary | ICD-10-CM | POA: Insufficient documentation

## 2022-02-19 DIAGNOSIS — R251 Tremor, unspecified: Secondary | ICD-10-CM | POA: Insufficient documentation

## 2022-02-19 DIAGNOSIS — R002 Palpitations: Secondary | ICD-10-CM | POA: Diagnosis present

## 2022-02-19 DIAGNOSIS — Z79899 Other long term (current) drug therapy: Secondary | ICD-10-CM | POA: Insufficient documentation

## 2022-02-19 LAB — CBC WITH DIFFERENTIAL/PLATELET
Abs Immature Granulocytes: 0.03 10*3/uL (ref 0.00–0.07)
Basophils Absolute: 0.1 10*3/uL (ref 0.0–0.1)
Basophils Relative: 1 %
Eosinophils Absolute: 0.1 10*3/uL (ref 0.0–0.5)
Eosinophils Relative: 1 %
HCT: 39 % (ref 36.0–46.0)
Hemoglobin: 13.2 g/dL (ref 12.0–15.0)
Immature Granulocytes: 0 %
Lymphocytes Relative: 22 %
Lymphs Abs: 2.2 10*3/uL (ref 0.7–4.0)
MCH: 31.3 pg (ref 26.0–34.0)
MCHC: 33.8 g/dL (ref 30.0–36.0)
MCV: 92.4 fL (ref 80.0–100.0)
Monocytes Absolute: 0.6 10*3/uL (ref 0.1–1.0)
Monocytes Relative: 6 %
Neutro Abs: 6.8 10*3/uL (ref 1.7–7.7)
Neutrophils Relative %: 70 %
Platelets: 255 10*3/uL (ref 150–400)
RBC: 4.22 MIL/uL (ref 3.87–5.11)
RDW: 12.5 % (ref 11.5–15.5)
WBC: 9.7 10*3/uL (ref 4.0–10.5)
nRBC: 0 % (ref 0.0–0.2)

## 2022-02-19 LAB — BASIC METABOLIC PANEL
Anion gap: 7 (ref 5–15)
BUN: 8 mg/dL (ref 6–20)
CO2: 23 mmol/L (ref 22–32)
Calcium: 8.7 mg/dL — ABNORMAL LOW (ref 8.9–10.3)
Chloride: 108 mmol/L (ref 98–111)
Creatinine, Ser: 0.86 mg/dL (ref 0.44–1.00)
GFR, Estimated: >60 mL/min (ref >60)
Glucose, Bld: 98 mg/dL (ref 70–99)
Potassium: 3.7 mmol/L (ref 3.5–5.1)
Sodium: 138 mmol/L (ref 135–145)

## 2022-02-19 LAB — I-STAT BETA HCG BLOOD, ED (MC, WL, AP ONLY): I-stat hCG, quantitative: <5 m[IU]/mL (ref <5)

## 2022-02-19 NOTE — ED Triage Notes (Signed)
Pt arrived POV from home c/o palpitations, tremors and numbness in your finger tips. 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  ?

## 2022-02-19 NOTE — Discharge Instructions (Addendum)
You have been evaluated for your symptoms.  Fortunately EKG did not show any signs of atrial fibrillation.  Your blood work looks normal.  No evidence of anemia, electrolyte imbalance, and your pregnancy test is negative.  I suspect your symptoms may be due to a bouts of anxiety attack.  It is important for you to follow-up closely with your primary care doctor and request for a different approach to manage anxiety aside from regular use of alprazolam.  You may benefit from a benzo taper as appropriate.  Return if you have any concern. ?

## 2022-02-19 NOTE — ED Provider Notes (Signed)
[facility] Provider [facility]   CSN: [id number] Arrival date & time: 02/19/22  [id number]     History  Chief Complaint  Patient presents with   Palpitations    Debra Ellison is a 26 y.o. female.  The history is provided by the patient and medical records. No language interpreter was used.  Palpitations  26 year old female significant history of anxiety, depression, seizures, anemia, presenting to ED with complaints of heart palpitation.  She reports feeling jittery, tingling sensation to hands, heart beating abnormally, for felt like she was having a seizure lasting for less than an hour.  She denies passing and denies any significant pain.  Home Medications Prior to Admission medications   Medication Sig Start Date End Date Taking? Authorizing Provider  ALPRAZolam Prudy Feeler(XANAX) 1 MG tablet Take 1 mg by mouth 2 (two) times daily.    [provider]  amoxicillin (AMOXIL) 500 MG capsule Take 1 capsule (500 mg total) by mouth 3 (three) times daily. 02/12/22   Elson AreasSofia, Leslie K, PA-C  buPROPion HCl (WELLBUTRIN XL PO) Take by mouth.    [provider]      Allergies    Patient has no known allergies.    Review of Systems   Review of Systems  Cardiovascular:  Positive for palpitations.  All other systems reviewed and are negative.  Physical Exam Updated Vital Signs BP 123/76 (BP Location: Right Arm)   Pulse (!) 58   Temp 98.3 F (36.8 C) (Oral)   Resp 15   Ht 5\' 4"  (1.626 m)   Wt 72.6 kg   SpO2 100%   BMI 27.46 kg/m  Physical Exam Vitals and nursing note reviewed.  Constitutional:      General: She is not in acute distress.    Appearance: She is well-developed.  HENT:     Head: Atraumatic.  Eyes:     Conjunctiva/sclera: Conjunctivae normal.  Cardiovascular:     Rate and Rhythm: Normal rate and regular rhythm.     Pulses: Normal pulses.     Heart sounds: Normal heart sounds.  Pulmonary:     Effort: Pulmonary effort is  normal.  Musculoskeletal:     Cervical back: Neck supple.  Skin:    Findings: No rash.  Neurological:     Mental Status: She is alert.  Psychiatric:        Mood and Affect: Mood normal.    ED Results / Procedures / Treatments   Labs (all labs ordered are listed, but only abnormal results are displayed) Labs Reviewed  BASIC METABOLIC PANEL - Abnormal; Notable for the following components:      Result Value   Calcium 8.7 (*)    All other components within normal limits  CBC WITH DIFFERENTIAL/PLATELET  I-STAT BETA HCG BLOOD, ED ([facility], [facility], [facility] ONLY)    EKG None  Date: 02/19/2022  Rate: 83  Rhythm: normal sinus rhythm  QRS Axis: normal  Intervals: normal  ST/T Wave abnormalities: normal  Conduction Disutrbances: none  Narrative Interpretation:   Old EKG Reviewed: No significant changes noted    Radiology No results found.  Procedures Procedures    Medications Ordered in ED Medications - No data to display  ED Course/ Medical Decision Making/ A&P                           Medical Decision Making  BP 123/76 (BP Location: Right Arm)   Pulse (!) 58  Temp 98.3 F (36.8 C) (Oral)   Resp 15   Ht 5\' 4"  (1.626 m)   Wt 72.6 kg   SpO2 100%   BMI 27.46 kg/m   3:03 PM This is a 26 year old female with history of anxiety and depression presenting with complaints of feeling tremors, tingling sensation to her fingers and heart palpitation that started earlier today.  She feels as if her heart was beating abnormally and she feels lightheadedness and thought that she may have had a brief passing out episodes.  She did recall exactly what happened.  She did not know whether she had seizures or having heart palpitation but it concerns her.  She felt that it could be related to her chronic use of lorazepam that she has been taking since the age of 16.  She mention she has had 2 separate seizure related activity in the past sometimes related to not taking her medication.  She has  never been diagnosed with seizures.  She denies any change in her medication change in her diet, increased energy drinks, having history of cardiac disease or any polysubstance use aside from occasional marijuana use and occasional alcohol use.  She does admits that she is having bouts of anxiety especially knowing how dependent she is to lorazepam.  She mentioned this episode lasting for less than an hour.  3:19 PM Patient symptoms suggestive of panic attack and less likely to be true seizure.  She is able to recall exactly what happened.  She has never been truly diagnosed with seizure before.  Given she does not have any signs of cardiac arrhythmia specifically no A-fib.  Her electrolyte panels are reassuring, no evidence of anemia, no signs of hypoglycemia.  At this time patient is back at baseline.  I do encourage patient to follow-up with the PCP and request to transition her regular benzodiazepine use to a different medication to manage anxiety.  She would probably benefit from a taper course as to decrease risk of benzo withdrawal seizure.  Patient is amenable with plan.  Patient is also PERC negative, doubt PE.  Symptoms not consistent with ACS.  At this time she is stable for discharge.  This patient presents to the ED for concern of heart palpitation, this involves an extensive number of treatment options, and is a complaint that carries with it a high risk of complications and morbidity.  The differential diagnosis includes anxiety, cardiac arrhythmia, thyrotoxicosis, myxedema coma, seizure, panic attack, anemia  Co morbidities that complicate the patient evaluation anxiety  Daily benzo use Additional history obtained:  Additional history obtained from patient External records from outside source obtained and reviewed including notes from OBGYN  Lab Tests:  I Ordered, and personally interpreted labs.  The pertinent results include:  reassuring labs   Cardiac Monitoring:  The patient  was maintained on a cardiac monitor.  I personally viewed and interpreted the cardiac monitored which showed an underlying rhythm of: NSR  Medicines ordered and prescription drug management:  Test Considered: head CT, but no true syncope or head injury  Cardiac echo, but no evidence of afib or signs of CHF  TSH, but pt is at baseline and normal EKG    Critical Interventions: none  Problem List / ED Course: heart palpitation  anxiety  Reevaluation:  After the interventions noted above, I reevaluated the patient and found that they have :resolved  Social Determinants of Health: tobacco use  Dispostion:  After consideration of the diagnostic results and the patients  response to treatment, I feel that the patent would benefit from outpt f/u.         Final Clinical Impression(s) / ED Diagnoses Final diagnoses:  Anxiety    Rx / DC Orders ED Discharge Orders     None         Fayrene Helper, PA-C 02/19/22 1522    Gloris Manchester, MD 02/23/22 [phone]

## 2022-02-19 NOTE — ED Provider Triage Note (Signed)
Emergency Medicine Provider Triage Evaluation Note ? ?Debra Ellison , a 26 y.o. female  was evaluated in triage.  Pt complains of palpitations and finger numbness.  Says that this has been going on since this morning.  History of anxiety and seizure.  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 ? ?Review of Systems  ?Positive: Palpitations, finger numbness ?Negative: Chest pain, shortness of breath ? ?Physical Exam  ?BP (!) 141/106 (BP Location: Right Arm)   Pulse (!) 105   Temp 98.1 ?F (36.7 ?C) (Oral)   Resp 16   Ht 5\' 4"  (1.626 m)   Wt 72.6 kg   SpO2 100%   BMI 27.46 kg/m?  ?Gen:   Awake, no distress   ?Resp:  Normal effort  ?MSK:   Moves extremities without difficulty  ?Other:  Sinus tachycardia ? ?Medical Decision Making  ?Medically screening exam initiated at 10:03 AM.  Appropriate orders placed.  Lovera Castronova was informed that the remainder of the evaluation will be completed by another provider, this initial triage assessment does not replace that evaluation, and the importance of remaining in the ED until their evaluation is complete. ? ? ?  ?Boleslaw Borghi A, PA-C ?02/19/22 1004 ? ?

## 2022-02-21 NOTE — ED Provider Notes (Signed)
?[facility] ? ? ? ?CSN: [id number] ?Arrival date & time: 02/12/22  1909 ? ? ?  ? ?History   ?Chief Complaint ?Chief Complaint  ?Patient presents with  ? Dental Pain  ? ? ?HPI ?Debra Ellison is a 26 y.o. female.  ? ?Pt complains of dental pain.  Pt does not have a dentist. ? ?The history is provided by the patient. No language interpreter was used.  ?Dental Pain ?Quality:  Aching ? ?Past Medical History:  ?Diagnosis Date  ? Anemia   ? Anxiety   ? Depression   ? Preterm labor   ? Seizure ([place]) 02/2021  ? Unsure if this was a true seizure. Patient states that she did not take her xanax and this caused her to have a seizure. Patient states that she passed out and was told that she was clenching her teeth. She has had no further problems and takes her Xanax daily.  ? Wears glasses 07/24/2021  ? ? ?There are no problems to display for this patient. ? ? ?Past Surgical History:  ?Procedure Laterality Date  ? DILATION AND EVACUATION N/A 07/25/2021  ? Procedure: DILATATION AND EVACUATION (D&E) 2ND TRIMESTER;  Surgeon: Jerelyn Charles, MD;  Location: [facility];  Service: Gynecology;  Laterality: N/A;  ? NO PAST SURGERIES    ? OPERATIVE ULTRASOUND N/A 07/25/2021  ? Procedure: OPERATIVE ULTRASOUND;  Surgeon: Jerelyn Charles, MD;  Location: [facility];  Service: Gynecology;  Laterality: N/A;  ? ? ?OB History   ? ? Gravida  ?3  ? Para  ?2  ? Term  ?2  ? Preterm  ?0  ? AB  ?   ? Living  ?1  ?  ? ? SAB  ?   ? IAB  ?   ? Ectopic  ?   ? Multiple  ?   ? Live Births  ?1  ?   ?  ?  ? ? ? ?Home Medications   ? ?Prior to Admission medications   ?Medication Sig Start Date End Date Taking? Authorizing Provider  ?amoxicillin (AMOXIL) 500 MG capsule Take 1 capsule (500 mg total) by mouth 3 (three) times daily. 02/12/22  Yes Fransico Meadow, PA-C  ?ALPRAZolam (XANAX) 1 MG tablet Take 1 mg by mouth 2 (two) times daily.    [provider]  ?buPROPion HCl (WELLBUTRIN XL PO) Take by mouth.     [provider]  ? ? ?Family History ?Family History  ?Problem Relation Age of Onset  ? Depression Mother   ? Asthma Mother   ? Depression Father   ? Cancer Maternal Grandmother   ? ? ?Social History ?Social History  ? ?Tobacco Use  ? Smoking status: Some Days  ?  Types: Cigarettes  ? Smokeless tobacco: Former  ?Vaping Use  ? Vaping Use: Some days  ? Substances: Nicotine  ?Substance Use Topics  ? Alcohol use: Never  ? Drug use: Yes  ?  Types: Marijuana  ?  Comment: last used one month ago as of 07/21/2021  ? ? ? ?Allergies   ?Patient has no known allergies. ? ? ?Review of Systems ?Review of Systems  ?All other systems reviewed and are negative. ? ? ?Physical Exam ?Triage Vital Signs ?ED Triage Vitals  ?Enc Vitals Group  ?   BP 02/12/22 1926 129/84  ?   Pulse Rate 02/12/22 1926 60  ?   Resp 02/12/22 1926 18  ?   Temp 02/12/22 1926 97.7 ?F (36.5 ?  C)  ?   Temp src --   ?   SpO2 02/12/22 1926 98 %  ?   Weight --   ?   Height --   ?   Head Circumference --   ?   Peak Flow --   ?   Pain Score 02/12/22 1925 9  ?   Pain Loc --   ?   Pain Edu? --   ?   Excl. in [place]? --   ? ?No data found. ? ?Updated Vital Signs ?BP 129/84 (BP Location: Right Arm)   Pulse 60   Temp 97.7 ?F (36.5 ?C)   Resp 18   SpO2 98%  ? ?Visual Acuity ?Right Eye Distance:   ?Left Eye Distance:   ?Bilateral Distance:   ? ?Right Eye Near:   ?Left Eye Near:    ?Bilateral Near:    ? ?Physical Exam ?Vitals and nursing note reviewed.  ?Constitutional:   ?   Appearance: She is well-developed.  ?HENT:  ?   Head: Normocephalic.  ?   Mouth/Throat:  ?   Comments: Swelling gumline ?Pulmonary:  ?   Effort: Pulmonary effort is normal.  ?Abdominal:  ?   General: There is no distension.  ?Musculoskeletal:     ?   General: Normal range of motion.  ?   Cervical back: Normal range of motion.  ?Neurological:  ?   Mental Status: She is alert and oriented to person, place, and time.  ?Psychiatric:     ?   Mood and Affect: Mood normal.  ? ? ? ?UC Treatments / Results   ?Labs ?(all labs ordered are listed, but only abnormal results are displayed) ?Labs Reviewed - No data to display ? ?EKG ? ? ?Radiology ?No results found. ? ?Procedures ?Procedures (including critical care time) ? ?Medications Ordered in UC ?Medications - No data to display ? ?Initial Impression / Assessment and Plan / UC Course  ?I have reviewed the triage vital signs and the nursing notes. ? ?Pertinent labs & imaging results that were available during my care of the patient were reviewed by me and considered in my medical decision making (see chart for details). ? ?  ? ? ?Final Clinical Impressions(s) / UC Diagnoses  ? ?Final diagnoses:  ?Toothache  ? ?Discharge Instructions   ?None ?  ? ?ED Prescriptions   ? ? Medication Sig Dispense Auth. Provider  ? amoxicillin (AMOXIL) 500 MG capsule Take 1 capsule (500 mg total) by mouth 3 (three) times daily. 30 capsule Fransico Meadow, [place]  ? ?  ? ?PDMP not reviewed this encounter. ?  ?Fransico Meadow, PA-C ?02/21/22 [id number] ? ?

## 2022-04-21 ENCOUNTER — Ambulatory Visit
Admission: EM | Admit: 2022-04-21 | Discharge: 2022-04-21 | Disposition: A | Discharge status: Home / Self Care | Payer: Medicaid Other | Attending: Physician Assistant | Admitting: Physician Assistant

## 2022-04-21 ENCOUNTER — Encounter: Payer: Self-pay | Admitting: Emergency Medicine

## 2022-04-21 DIAGNOSIS — K0889 Other specified disorders of teeth and supporting structures: Secondary | ICD-10-CM

## 2022-04-21 MED ORDER — KETOROLAC TROMETHAMINE 30 MG/ML IJ SOLN
30.0000 mg | Freq: Once | INTRAMUSCULAR | Status: AC
  Administered 2022-04-21: 30 mg via INTRAMUSCULAR

## 2022-04-21 MED ORDER — AMOXICILLIN-POT CLAVULANATE 875-125 MG PO TABS: 1.0000 | ORAL_TABLET | Freq: Two times a day (BID) | ORAL | 0 refills | Status: AC

## 2022-04-21 NOTE — Discharge Instructions (Addendum)
Take antibiotic as prescribed Recommend ice to right side of face Can continue with ibuprofen as needed Keep appointment with dentist on Wednesday.

## 2022-04-21 NOTE — ED Triage Notes (Signed)
Patient c/o right lower dental abscess x 1 month, worse last night.  Patient does have an appt w/a dentist on Wednesday.  Patient has tried salt rinse and peroxide.  Patient has taken [facility] Powder and Ibuprofen.

## 2022-04-21 NOTE — ED Provider Notes (Signed)
[facility]    CSN: [id number] Arrival date & time: 04/21/22  1318      History   Chief Complaint Chief Complaint  Patient presents with   Dental Pain    HPI Debra Ellison is a 25 y.o. female.   Pt complains of right lower dental pain that started about one month ago.  Pain became worse last night.  She reports she has a dentist appointment on Wednesday this week.  She called the dentist and was advised to be evaluated in UC for antibiotic and something for pain.  She cannot take tylenol as this causes hives.  She has tried salt water gargles, peroxide, ibuprofen, and BC powder.  Denies fever, chills, facial swelling.     Past Medical History:  Diagnosis Date   Anemia    Anxiety    Depression    Preterm labor    Seizure (HCC) 02/2021   Unsure if this was a true seizure. Patient states that she did not take her xanax and this caused her to have a seizure. Patient states that she passed out and was told that she was clenching her teeth. She has had no further problems and takes her Xanax daily.   Wears glasses 07/24/2021    There are no problems to display for this patient.   Past Surgical History:  Procedure Laterality Date   DILATION AND EVACUATION N/A 07/25/2021   Procedure: DILATATION AND EVACUATION (D&E) 2ND TRIMESTER;  Surgeon: Marlow Baars, MD;  Location: [facility] [place];  Service: Gynecology;  Laterality: N/A;   NO PAST SURGERIES     OPERATIVE ULTRASOUND N/A 07/25/2021   Procedure: OPERATIVE ULTRASOUND;  Surgeon: Marlow Baars, MD;  Location: [facility] [place];  Service: Gynecology;  Laterality: N/A;    OB History     Gravida  3   Para  2   Term  2   Preterm  0   AB      Living  1      SAB      IAB      Ectopic      Multiple      Live Births  1            Home Medications    Prior to Admission medications   Medication Sig Start Date End Date Taking? Authorizing Provider  ALPRAZolam Prudy Feeler) 1  MG tablet Take 1 mg by mouth 2 (two) times daily.   Yes [provider]  amoxicillin-clavulanate (AUGMENTIN) 875-125 MG tablet Take 1 tablet by mouth every 12 (twelve) hours. 04/21/22  Yes Ward, Tylene Fantasia, PA-C  buPROPion HCl (WELLBUTRIN XL PO) Take by mouth.   Yes [provider]    Family History Family History  Problem Relation Age of Onset   Depression Mother    Asthma Mother    Depression Father    Cancer Maternal Grandmother     Social History Social History   Tobacco Use   Smoking status: Some Days    Types: Cigarettes   Smokeless tobacco: Former  Building services engineer Use: Some days   Substances: Nicotine  Substance Use Topics   Alcohol use: Never   Drug use: Yes    Types: Marijuana    Comment: last used one month ago as of 07/21/2021     Allergies   Tylenol [acetaminophen]   Review of Systems Review of Systems  Constitutional:  Negative for chills and fever.  HENT:  Positive for dental  problem. Negative for ear pain and sore throat.   Eyes:  Negative for pain and visual disturbance.  Respiratory:  Negative for cough and shortness of breath.   Cardiovascular:  Negative for chest pain and palpitations.  Gastrointestinal:  Negative for abdominal pain and vomiting.  Genitourinary:  Negative for dysuria and hematuria.  Musculoskeletal:  Negative for arthralgias and back pain.  Skin:  Negative for color change and rash.  Neurological:  Negative for seizures and syncope.  All other systems reviewed and are negative.    Physical Exam Triage Vital Signs ED Triage Vitals  Enc Vitals Group     BP 04/21/22 1325 (!) 141/95     Pulse Rate 04/21/22 1325 76     Resp 04/21/22 1325 18     Temp 04/21/22 1325 98.4 F (36.9 C)     Temp Source 04/21/22 1325 Oral     SpO2 04/21/22 1325 97 %     Weight 04/21/22 1327 160 lb (72.6 kg)     Height 04/21/22 1327 5\' 4"  (1.626 m)     Head Circumference --      Peak Flow --      Pain Score 04/21/22 1327 10      Pain Loc --      Pain Edu? --      Excl. in GC? --    No data found.  Updated Vital Signs BP (!) 141/95 (BP Location: Left Arm)   Pulse 76   Temp 98.4 F (36.9 C) (Oral)   Resp 18   Ht 5\' 4"  (1.626 m)   Wt 160 lb (72.6 kg)   LMP 04/09/2022   SpO2 97%   BMI 27.46 kg/m   Visual Acuity Right Eye Distance:   Left Eye Distance:   Bilateral Distance:    Right Eye Near:   Left Eye Near:    Bilateral Near:     Physical Exam Vitals and nursing note reviewed.  Constitutional:      General: She is not in acute distress.    Appearance: She is well-developed.  HENT:     Head: Normocephalic and atraumatic.     Mouth/Throat:   Eyes:     Conjunctiva/sclera: Conjunctivae normal.  Cardiovascular:     Rate and Rhythm: Normal rate and regular rhythm.     Heart sounds: No murmur heard. Pulmonary:     Effort: Pulmonary effort is normal. No respiratory distress.     Breath sounds: Normal breath sounds.  Abdominal:     Palpations: Abdomen is soft.     Tenderness: There is no abdominal tenderness.  Musculoskeletal:        General: No swelling.     Cervical back: Neck supple.  Skin:    General: Skin is warm and dry.     Capillary Refill: Capillary refill takes less than 2 seconds.  Neurological:     Mental Status: She is alert.  Psychiatric:        Mood and Affect: Mood normal.      UC Treatments / Results  Labs (all labs ordered are listed, but only abnormal results are displayed) Labs Reviewed - No data to display  EKG   Radiology No results found.  Procedures Procedures (including critical care time)  Medications Ordered in UC Medications  ketorolac (TORADOL) 30 MG/ML injection 30 mg (30 mg Intramuscular Given 04/21/22 1347)    Initial Impression / Assessment and Plan / UC Course  I have reviewed the triage vital signs and the  nursing notes.  Pertinent labs & imaging results that were available during my care of the patient were reviewed by me and  considered in my medical decision making (see chart for details).     Dental pain, toradol given in clinic today which pt reports provided some relief. Antibiotic prescribed.  Supportive care discussed. Advised to keep appointment with dentist later this week.  Final Clinical Impressions(s) / UC Diagnoses   Final diagnoses:  Pain, dental     Discharge Instructions      Take antibiotic as prescribed Recommend ice to right side of face Can continue with ibuprofen as needed Keep appointment with dentist on Wednesday.    ED Prescriptions     Medication Sig Dispense Auth. Provider   amoxicillin-clavulanate (AUGMENTIN) 875-125 MG tablet Take 1 tablet by mouth every 12 (twelve) hours. 14 tablet Ward, Tylene Fantasia, PA-C      PDMP not reviewed this encounter.   Ward, Tylene Fantasia, PA-C 04/21/22 1406

## 2022-04-23 ENCOUNTER — Ambulatory Visit
Admission: EM | Admit: 2022-04-23 | Discharge: 2022-04-23 | Discharge status: Against Medical Advice | Payer: Medicaid Other

## 2022-04-23 NOTE — ED Notes (Signed)
Called twice in lobby with no answer

## 2022-07-23 ENCOUNTER — Ambulatory Visit: Payer: Medicaid Other | Admitting: Nurse Practitioner

## 2022-09-19 ENCOUNTER — Ambulatory Visit: Payer: Medicaid Other | Admitting: Nurse Practitioner
